# Patient Record
Sex: Male | Born: 1985 | Race: Black or African American | Hispanic: No | Marital: Married | Smoking: Never smoker
Health system: Southern US, Community
[De-identification: ages and names within clinical notes are randomized; demographics above are authoritative.]

## PROBLEM LIST (undated history)

## (undated) ENCOUNTER — Ambulatory Visit: Admission: EM | Source: Home / Self Care

## (undated) DIAGNOSIS — G510 Bell's palsy: Secondary | ICD-10-CM

## (undated) HISTORY — PX: ACHILLES TENDON REPAIR: SUR1153

## (undated) HISTORY — DX: Bell's palsy: G51.0

---

## 2011-11-17 ENCOUNTER — Emergency Department (HOSPITAL_COMMUNITY)
Admission: EM | Admit: 2011-11-17 | Discharge: 2011-11-17 | Disposition: A | Discharge status: Home / Self Care | Payer: BC Managed Care – PPO | Attending: Emergency Medicine | Admitting: Emergency Medicine

## 2011-11-17 DIAGNOSIS — R220 Localized swelling, mass and lump, head: Secondary | ICD-10-CM | POA: Insufficient documentation

## 2011-11-17 DIAGNOSIS — R142 Eructation: Secondary | ICD-10-CM | POA: Insufficient documentation

## 2011-11-17 DIAGNOSIS — R141 Gas pain: Secondary | ICD-10-CM | POA: Insufficient documentation

## 2011-11-17 DIAGNOSIS — K111 Hypertrophy of salivary gland: Secondary | ICD-10-CM | POA: Insufficient documentation

## 2011-11-17 DIAGNOSIS — R1310 Dysphagia, unspecified: Secondary | ICD-10-CM | POA: Insufficient documentation

## 2011-11-17 DIAGNOSIS — K047 Periapical abscess without sinus: Secondary | ICD-10-CM | POA: Insufficient documentation

## 2011-11-17 DIAGNOSIS — R070 Pain in throat: Secondary | ICD-10-CM | POA: Insufficient documentation

## 2011-11-17 DIAGNOSIS — R6884 Jaw pain: Secondary | ICD-10-CM | POA: Insufficient documentation

## 2011-11-17 MED ORDER — TRAMADOL-ACETAMINOPHEN 37.5-325 MG PO TABS: ORAL_TABLET | ORAL | Status: AC

## 2011-11-17 MED ORDER — PENICILLIN V POTASSIUM 500 MG PO TABS: 500.0000 mg | ORAL_TABLET | Freq: Four times a day (QID) | ORAL | Status: AC

## 2011-11-17 NOTE — ED Notes (Signed)
Patient presents with sore throat since yesterday with difficulty swallowing.

## 2011-11-17 NOTE — ED Provider Notes (Signed)
History     CSN: [id number] Arrival date & time: 11/17/2011 11:11 AM   First MD Initiated Contact with Patient 11/17/11 1146      Chief Complaint  Patient presents with  . Sore Throat    (Consider location/radiation/quality/duration/timing/severity/associated sxs/prior treatment) HPI  Patient relates yesterday started getting pain in his left lower jaw. He states it hurts when he swallows it also hurts when he chews food. He denies any fever or left ear pain. He states he's never had it before. Nothing makes it feel better. They also note that he has a swelling underneath his jaw that gets bigger and smaller.  No primary care physician or dentist  History reviewed. No pertinent past medical history.  History reviewed. No pertinent past surgical history.  History reviewed. No pertinent family history.  History  Substance Use Topics  . Smoking status: Current Some Day Smoker  . Smokeless tobacco: Not on file  . Alcohol Use: Yes   employed at [employer]    Review of Systems  All other systems reviewed and are negative.    Allergies  Review of patient's allergies indicates no known allergies.  Home Medications  none  BP 134/73  Pulse 57  Temp(Src) 98.2 F (36.8 C) (Oral)  Resp 16  SpO2 98% Vital signs bradycardia otherwise normal  Physical Exam  Nursing note and vitals reviewed. Constitutional: He is oriented to person, place, and time. He appears well-developed and well-nourished.  Non-toxic appearance. He does not appear ill. No distress.  HENT:  Head: Normocephalic and atraumatic.  Right Ear: External ear normal.  Left Ear: External ear normal.  Nose: Nose normal. No mucosal edema or rhinorrhea.  Mouth/Throat: Oropharynx is clear and moist and mucous membranes are normal. No dental abscesses or uvula swelling.       Patient's noted to have mild erythema of these left ear however still translucent and no free fluid seen behind it. On exam of his mouth his teeth  are in very good shape there is no dental caries noted. His posterior pharynx is normal without redness swelling or tonsillar enlargement. There issoft tissue swelling seen except around the base of his left molar where he has swelling and redness on the inner aspect and he appears to be having any impacted was sent to trying to come out of the gumline in the same area. He's also noted to have a swelling in the submental region that is approximately 2 cm in size and soft but slightly firm. They state that this gets bigger and larger.  Eyes: Conjunctivae and EOM are normal. Pupils are equal, round, and reactive to light.  Neck: Normal range of motion and full passive range of motion without pain. Neck supple.  Pulmonary/Chest: Effort normal. No respiratory distress. He has no rhonchi. He exhibits no crepitus.  Abdominal: Normal appearance. He exhibits distension.  Musculoskeletal: Normal range of motion. He exhibits no edema and no tenderness.       Moves all extremities well.   Lymphadenopathy:    He has no cervical adenopathy.  Neurological: He is alert and oriented to person, place, and time. He has normal strength. No cranial nerve deficit.  Skin: Skin is warm, dry and intact. No rash noted. No erythema. No pallor.  Psychiatric: He has a normal mood and affect. His speech is normal and behavior is normal. His mood appears not anxious.    ED Course  Procedures (including critical care time)  Results for orders placed during the hospital  encounter of 11/17/11  RAPID STREP SCREEN      Component Value Range   Streptococcus, Group A Screen (Direct) NEGATIVE  NEGATIVE     Discharge diagnosis 1. Abscess, dental   2. Salivary gland swelling    New Prescriptions   PENICILLIN V POTASSIUM (VEETID) 500 MG TABLET    Take 1 tablet (500 mg total) by mouth 4 (four) times daily.   TRAMADOL-ACETAMINOPHEN (ULTRACET) 37.5-325 MG PER TABLET    2 tabs po QID prn pain   Plan refer to oral surgery  MDM            Ward Givens, MD 11/17/11 2151

## 2012-01-14 ENCOUNTER — Encounter (HOSPITAL_COMMUNITY): Payer: Self-pay | Admitting: *Deleted

## 2012-01-14 ENCOUNTER — Emergency Department (HOSPITAL_COMMUNITY)
Admission: EM | Admit: 2012-01-14 | Discharge: 2012-01-14 | Disposition: A | Discharge status: Home / Self Care | Payer: BC Managed Care – PPO | Attending: Emergency Medicine | Admitting: Emergency Medicine

## 2012-01-14 DIAGNOSIS — R599 Enlarged lymph nodes, unspecified: Secondary | ICD-10-CM | POA: Insufficient documentation

## 2012-01-14 DIAGNOSIS — B9789 Other viral agents as the cause of diseases classified elsewhere: Secondary | ICD-10-CM | POA: Insufficient documentation

## 2012-01-14 DIAGNOSIS — R070 Pain in throat: Secondary | ICD-10-CM | POA: Insufficient documentation

## 2012-01-14 DIAGNOSIS — R05 Cough: Secondary | ICD-10-CM | POA: Insufficient documentation

## 2012-01-14 DIAGNOSIS — B349 Viral infection, unspecified: Secondary | ICD-10-CM

## 2012-01-14 DIAGNOSIS — R059 Cough, unspecified: Secondary | ICD-10-CM | POA: Insufficient documentation

## 2012-01-14 DIAGNOSIS — J3489 Other specified disorders of nose and nasal sinuses: Secondary | ICD-10-CM | POA: Insufficient documentation

## 2012-01-14 NOTE — ED Notes (Signed)
Sore throat and runny nose for one week.  No fever chills or sob

## 2012-01-14 NOTE — ED Provider Notes (Signed)
History     CSN: [id number]  Arrival date & time 01/14/12  1336   First MD Initiated Contact with Patient 01/14/12 1419      Chief Complaint  Patient presents with  . Sore Throat    (Consider location/radiation/quality/duration/timing/severity/associated sxs/prior treatment) Patient is a 26 y.o. male presenting with pharyngitis. The history is provided by the patient.  Sore Throat This is a new problem. The current episode started in the past 7 days. The problem occurs constantly. The problem has been unchanged. Associated symptoms include congestion, coughing and a sore throat. Pertinent negatives include no abdominal pain, chest pain, chills, fever, headaches, myalgias, nausea, neck pain, rash, vomiting or weakness. Associated symptoms comments: Positive rhinorrhea. The symptoms are aggravated by swallowing. Treatments tried: cough medicine. The treatment provided no relief.    History reviewed. No pertinent past medical history.  History reviewed. No pertinent past surgical history.  No family history on file.  History  Substance Use Topics  . Smoking status: Current Some Day Smoker  . Smokeless tobacco: Not on file  . Alcohol Use: Yes      Review of Systems  Constitutional: Negative for fever and chills.  HENT: Positive for congestion and sore throat. Negative for ear pain, trouble swallowing, neck pain and voice change.   Eyes: Negative for pain and visual disturbance.  Respiratory: Positive for cough. Negative for chest tightness and shortness of breath.   Cardiovascular: Negative for chest pain.  Gastrointestinal: Negative for nausea, vomiting and abdominal pain.  Musculoskeletal: Negative for myalgias and back pain.  Skin: Negative for rash.  Neurological: Negative for dizziness, weakness and headaches.  Psychiatric/Behavioral: Negative for confusion.    Allergies  Review of patient's allergies indicates no known allergies.  Home Medications   Current  Outpatient Rx  Name Route Sig Dispense Refill  . OVER THE COUNTER MEDICATION Oral Take 30 mLs by mouth at bedtime as needed. For cough/sleep  Adult cough medicine      BP 138/69  Pulse 68  Temp(Src) 98.3 F (36.8 C) (Oral)  Resp 20  SpO2 97%  Physical Exam  Nursing note and vitals reviewed. Constitutional: He is oriented to person, place, and time. He appears well-developed and well-nourished. No distress.  HENT:  Head: Normocephalic and atraumatic.  Right Ear: Hearing, external ear and ear canal normal. Tympanic membrane is erythematous.  Left Ear: Hearing, tympanic membrane, external ear and ear canal normal.  Nose: Rhinorrhea present. Right sinus exhibits no maxillary sinus tenderness and no frontal sinus tenderness. Left sinus exhibits no maxillary sinus tenderness and no frontal sinus tenderness.  Mouth/Throat: Mucous membranes are normal. No uvula swelling. Posterior oropharyngeal erythema present. No oropharyngeal exudate, posterior oropharyngeal edema or tonsillar abscesses.  Eyes: Conjunctivae are normal. Pupils are equal, round, and reactive to light.  Neck: Normal range of motion. Neck supple. No tracheal deviation present.  Cardiovascular: Normal rate, regular rhythm and normal heart sounds.   No murmur heard. Pulmonary/Chest: Effort normal and breath sounds normal. No stridor. No respiratory distress. He has no wheezes. He exhibits no tenderness.  Abdominal: Soft. Bowel sounds are normal. He exhibits no distension. There is no tenderness.  Musculoskeletal: He exhibits no edema and no tenderness.  Lymphadenopathy:    He has cervical adenopathy.  Neurological: He is alert and oriented to person, place, and time. No cranial nerve deficit.  Skin: Skin is warm and dry. No rash noted.    ED Course  Procedures (including critical care time)   Labs Reviewed  RAPID STREP SCREEN   No results found.   1. Viral illness       MDM  Viral pharyngitis s/s. Negative  strep screen. Discussed use of OTC medications with pt, who voices understanding.        Shaaron Adler, [place] 01/14/12 [phone]

## 2012-01-15 NOTE — ED Provider Notes (Signed)
Medical screening examination/treatment/procedure(s) were performed by non-physician practitioner and as supervising physician I was immediately available for consultation/collaboration.  Toy Baker, MD 01/15/12 [phone]

## 2012-04-07 ENCOUNTER — Encounter (HOSPITAL_COMMUNITY): Payer: Self-pay

## 2012-04-07 ENCOUNTER — Emergency Department (INDEPENDENT_AMBULATORY_CARE_PROVIDER_SITE_OTHER)
Admission: EM | Admit: 2012-04-07 | Discharge: 2012-04-07 | Disposition: A | Discharge status: Home / Self Care | Payer: BC Managed Care – PPO | Source: Home / Self Care | Attending: Family Medicine | Admitting: Family Medicine

## 2012-04-07 DIAGNOSIS — S86011A Strain of right Achilles tendon, initial encounter: Secondary | ICD-10-CM

## 2012-04-07 DIAGNOSIS — S93499A Sprain of other ligament of unspecified ankle, initial encounter: Secondary | ICD-10-CM

## 2012-04-07 DIAGNOSIS — X500XXA Overexertion from strenuous movement or load, initial encounter: Secondary | ICD-10-CM

## 2012-04-07 DIAGNOSIS — S96819A Strain of other specified muscles and tendons at ankle and foot level, unspecified foot, initial encounter: Secondary | ICD-10-CM

## 2012-04-07 MED ORDER — HYDROCODONE-ACETAMINOPHEN 5-325 MG PO TABS: ORAL_TABLET | ORAL | Status: AC

## 2012-04-07 NOTE — Discharge Instructions (Signed)
Please remain non-weightbearing until follow-up with Orthopaedic surgeon. Please call the Orthopaedic practice listed on your discharge papers. Take the medication as directed and as needed. Do not work or drive while taking this medication.

## 2012-04-07 NOTE — ED Provider Notes (Signed)
History     CSN: [id number]  Arrival date & time 04/07/12  1549   First MD Initiated Contact with Patient 04/07/12 1618      Chief Complaint  Patient presents with  . Leg Pain    (Consider location/radiation/quality/duration/timing/severity/associated sxs/prior treatment) HPI Comments: Gary Gardner presents for evaluation of pain in his RIGHT ankle. He reports onset of symptoms yesterday evening as he was playing basketball. He reports sudden acceleration from a standing point, at which point, he felt a sharp and sudden pain, as if he had been struck with a basketball in his ankle.    Patient is a 26 y.o. male presenting with leg pain. The history is provided by the patient.  Leg Pain  The incident occurred yesterday. The incident occurred at the gym. Injury mechanism: sudden acceleration. The pain is present in the right ankle. The quality of the pain is described as aching and throbbing. The pain is moderate. The pain has been constant since onset. Associated symptoms include inability to bear weight and loss of motion. He reports no foreign bodies present. The symptoms are aggravated by palpation, bearing weight and activity. He has tried nothing for the symptoms.    History reviewed. No pertinent past medical history.  History reviewed. No pertinent past surgical history.  History reviewed. No pertinent family history.  History  Substance Use Topics  . Smoking status: Current Some Day Smoker  . Smokeless tobacco: Not on file  . Alcohol Use: Yes      Review of Systems  Constitutional: Negative.   HENT: Negative.   Eyes: Negative.   Respiratory: Negative.   Cardiovascular: Negative.   Gastrointestinal: Negative.   Genitourinary: Negative.   Musculoskeletal: Positive for gait problem.       RIGHT ankle pain  Skin: Negative.   Neurological: Negative.     Allergies  Review of patient's allergies indicates no known allergies.  Home Medications   Current Outpatient Rx    Name Route Sig Dispense Refill  . OVER THE COUNTER MEDICATION Oral Take 30 mLs by mouth at bedtime as needed. For cough/sleep  Adult cough medicine    . HYDROCODONE-ACETAMINOPHEN 5-325 MG PO TABS  Take one to two tablets every 4 to 6 hours as needed for pain 30 tablet 0    BP 148/83  Pulse 54  Temp(Src) 97.6 F (36.4 C) (Oral)  Resp 18  SpO2 98%  Physical Exam  Nursing note and vitals reviewed. Constitutional: He is oriented to person, place, and time. He appears well-developed and well-nourished.  HENT:  Head: Normocephalic and atraumatic.  Eyes: EOM are normal.  Neck: Normal range of motion.  Pulmonary/Chest: Effort normal.  Musculoskeletal:       Right ankle: He exhibits decreased range of motion. He exhibits no ecchymosis and normal pulse. Achilles tendon exhibits pain and abnormal Thompson's test results. Achilles tendon exhibits no defect.  Neurological: He is alert and oriented to person, place, and time.  Skin: Skin is warm and dry.  Psychiatric: His behavior is normal.    ED Course  Procedures (including critical care time)  Labs Reviewed - No data to display No results found.   1. Achilles rupture, right       MDM  Placed in leg splint with mild plantar flexion, will follow up with [facility]; given rx for hydrocodone PRN        Renaee Munda, MD 04/07/12 1721

## 2012-04-07 NOTE — ED Notes (Signed)
States sudden onset of pain right distal calf area just as was beginning to play basket ball last PM; right achilles area tight, tender, calf tight ; painful w attempted flexion/extension of foot; food w/d/color good

## 2013-03-22 ENCOUNTER — Emergency Department (HOSPITAL_COMMUNITY)
Admission: EM | Admit: 2013-03-22 | Discharge: 2013-03-22 | Disposition: A | Discharge status: Home / Self Care | Payer: BC Managed Care – PPO | Source: Home / Self Care | Attending: Family Medicine | Admitting: Family Medicine

## 2013-03-22 ENCOUNTER — Encounter (HOSPITAL_COMMUNITY): Payer: Self-pay | Admitting: Emergency Medicine

## 2013-03-22 DIAGNOSIS — A0811 Acute gastroenteropathy due to Norwalk agent: Secondary | ICD-10-CM

## 2013-03-22 MED ORDER — ONDANSETRON 4 MG PO TBDP
4.0000 mg | ORAL_TABLET | Freq: Once | ORAL | Status: AC
  Administered 2013-03-22: 4 mg via ORAL

## 2013-03-22 MED ORDER — ONDANSETRON HCL 4 MG PO TABS: 4.0000 mg | ORAL_TABLET | Freq: Four times a day (QID) | ORAL | Status: DC

## 2013-03-22 MED ORDER — ONDANSETRON 4 MG PO TBDP
ORAL_TABLET | ORAL | Status: AC
  Filled 2013-03-22: qty 1

## 2013-03-22 NOTE — ED Notes (Signed)
Nnausea/diarrhea, no vomiting, no fever.  Onset yesterday

## 2013-03-22 NOTE — ED Provider Notes (Signed)
History     CSN: [id number]  Arrival date & time 03/22/13  1349   First MD Initiated Contact with Patient 03/22/13 1351      Chief Complaint  Patient presents with  . Nausea    (Consider location/radiation/quality/duration/timing/severity/associated sxs/prior treatment) Patient is a 27 y.o. male presenting with diarrhea. The history is provided by the patient.  Diarrhea Quality:  Watery (worse after chinese food and peanut butter and jelly today.) Severity:  Mild Duration:  1 day Timing:  Intermittent Progression:  Unchanged Associated symptoms: no chills, no fever and no vomiting   Associated symptoms comment:  Nausea. Risk factors: no sick contacts, no suspicious food intake and no travel to endemic areas     History reviewed. No pertinent past medical history.  History reviewed. No pertinent past surgical history.  No family history on file.  History  Substance Use Topics  . Smoking status: Never Smoker   . Smokeless tobacco: Not on file  . Alcohol Use: No      Review of Systems  Constitutional: Negative.  Negative for fever and chills.  Gastrointestinal: Positive for nausea and diarrhea. Negative for vomiting, blood in stool and anal bleeding.  Skin: Negative.     Allergies  Review of patient's allergies indicates no known allergies.  Home Medications   Current Outpatient Rx  Name  Route  Sig  Dispense  Refill  . ondansetron (ZOFRAN) 4 MG tablet   Oral   Take 1 tablet (4 mg total) by mouth every 6 (six) hours.   8 tablet   0   . OVER THE COUNTER MEDICATION   Oral   Take 30 mLs by mouth at bedtime as needed. For cough/sleep  Adult cough medicine           BP 122/78  Pulse 75  Temp(Src) 99.3 F (37.4 C) (Oral)  Resp 16  SpO2 100%  Physical Exam  Nursing note and vitals reviewed. Constitutional: He is oriented to person, place, and time. He appears well-developed and well-nourished.  HENT:  Head: Normocephalic.  Mouth/Throat:  Oropharynx is clear and moist.  Abdominal: Soft. Bowel sounds are normal. He exhibits no distension and no mass. There is no tenderness. There is no rebound and no guarding.  Neurological: He is alert and oriented to person, place, and time.  Skin: Skin is warm and dry.  Psychiatric: He has a normal mood and affect.    ED Course  Procedures (including critical care time)  Labs Reviewed - No data to display No results found.   1. Gastroenteritis due to norovirus       MDM          Linna Hoff, MD 03/22/13 1430

## 2013-08-24 ENCOUNTER — Emergency Department (INDEPENDENT_AMBULATORY_CARE_PROVIDER_SITE_OTHER)
Admission: EM | Admit: 2013-08-24 | Discharge: 2013-08-24 | Disposition: A | Discharge status: Home / Self Care | Payer: BC Managed Care – PPO | Source: Home / Self Care | Attending: Family Medicine | Admitting: Family Medicine

## 2013-08-24 ENCOUNTER — Encounter (HOSPITAL_COMMUNITY): Payer: Self-pay | Admitting: Emergency Medicine

## 2013-08-24 DIAGNOSIS — B029 Zoster without complications: Secondary | ICD-10-CM

## 2013-08-24 MED ORDER — PREDNISONE 5 MG PO TABS: ORAL_TABLET | ORAL | Status: DC

## 2013-08-24 MED ORDER — MUPIROCIN CALCIUM 2 % EX CREA: TOPICAL_CREAM | Freq: Three times a day (TID) | CUTANEOUS | Status: DC

## 2013-08-24 NOTE — ED Provider Notes (Signed)
Romualdo Prosise is a 27 y.o. male who presents to Urgent Care today for rash right trunk. Patient notes a vesicular itchy rash on his right side back extending onto the lateral chest wall. Has been present for about 2 weeks. He's tried using rubbing alcohol which has not helped. He denies any fevers chills radiating pain weakness or numbness nausea vomiting or diarrhea. He denies any injury. It is not painful. He feels well otherwise.   History reviewed. No pertinent past medical history. History  Substance Use Topics  . Smoking status: Never Smoker   . Smokeless tobacco: Not on file  . Alcohol Use: No   ROS as above Medications reviewed. No current facility-administered medications for this encounter.   Current Outpatient Prescriptions  Medication Sig Dispense Refill  . mupirocin cream (BACTROBAN) 2 % Apply topically 3 (three) times daily.  15 g  0  . predniSONE (DELTASONE) 5 MG tablet 3 pills po day 1-3 2 pills po day 4-6 1 pill   po day 7-10  18 tablet  0    Exam:  BP 132/61  Pulse 55  Temp(Src) 98.6 F (37 C) (Oral)  Resp 16  SpO2 100% Gen: Well NAD HEENT: EOMI,  MMM Lungs: CTABL Nl WOB Heart: RRR no MRG Exts: Non edematous BL  LE, warm and well perfused.  Skin: Crusted vesicular rash approximately 5 cm x 3 cm on right posterior chest wall in a dermatomal pattern.   No results found for this or any previous visit (from the past 24 hour(s)). No results found.  Assessment and Plan: 28 y.o. male with shingles. Patient is outside of one of her treatment with antivirals. Will use low-dose prednisone as well as Bactroban. Prednisone will help decrease inflammation and itching. Bactroban to treat possible secondary infection with bacteria. Followup as needed Discussed warning signs or symptoms. Please see discharge instructions. Patient expresses understanding.      Rodolph Bong, MD 08/24/13 1128

## 2013-08-24 NOTE — ED Notes (Signed)
Rash to right mid back, reports area itches badly.  Open wound, but raised, blistery appearance to area.  Denies any other areas

## 2013-12-31 ENCOUNTER — Encounter (HOSPITAL_COMMUNITY): Payer: Self-pay | Admitting: Emergency Medicine

## 2013-12-31 ENCOUNTER — Emergency Department (INDEPENDENT_AMBULATORY_CARE_PROVIDER_SITE_OTHER)
Admission: EM | Admit: 2013-12-31 | Discharge: 2013-12-31 | Disposition: A | Discharge status: Home / Self Care | Payer: BC Managed Care – PPO | Source: Home / Self Care | Attending: Emergency Medicine | Admitting: Emergency Medicine

## 2013-12-31 DIAGNOSIS — L42 Pityriasis rosea: Secondary | ICD-10-CM

## 2013-12-31 MED ORDER — HYDROXYZINE HCL 25 MG PO TABS: 25.0000 mg | ORAL_TABLET | Freq: Four times a day (QID) | ORAL | Status: DC

## 2013-12-31 MED ORDER — TRIAMCINOLONE ACETONIDE 0.1 % EX CREA: 1.0000 "application " | TOPICAL_CREAM | Freq: Three times a day (TID) | CUTANEOUS | Status: DC

## 2013-12-31 NOTE — ED Notes (Signed)
Pt c/o rash onset 1 week Rash on abd, chest, arms Reports his girlfriend had it but it went away Denies: f/v/n/d Alert w/no signs of acute distress.

## 2013-12-31 NOTE — Discharge Instructions (Signed)

## 2013-12-31 NOTE — ED Provider Notes (Signed)
  Chief Complaint    Chief Complaint  Patient presents with  . Rash    History of Present Illness      Gary Gardner is a 28 year old male who has had a one-week history of a rash that first appeared on his arms, spread to his trunk, face, neck, and legs. It is mildly itchy. He has not been in contact with any allergens or antigens. No new foods or medications. No systemic symptoms such as fever, chills, headache, URI symptoms, sore throat, adenopathy, or cough. He has not been exposed to anyone with a similar rash.  Review of Systems   Other than as noted above, the patient denies any of the following symptoms: Systemic:  No fever, chills, or myalgias. ENT:  No nasal congestion, rhinorrhea, sore throat, swelling of lips, tongue or throat. Resp:  No cough, wheezing, or shortness of breath.  PMFSH    Past medical history, family history, social history, meds, and allergies were reviewed.   Physical Exam     Vital signs:  BP 118/69  Pulse 67  Temp(Src) 97.7 F (36.5 C) (Oral)  Resp 17  SpO2 95% Gen:  Alert, oriented, in no distress. ENT:  Pharynx clear, no intraoral lesions, moist mucous membranes. Lungs:  Clear to auscultation. Skin:  He has a patulous squamous rash with hyperpigmented, scaly, oval lesions and a "Christmas tree" distribution.  Assessment    The encounter diagnosis was Pityriasis rosea.  Plan     1.  Meds:  The following meds were prescribed:   Discharge Medication List as of 12/31/2013 12:43 PM    START taking these medications   Details  hydrOXYzine (ATARAX/VISTARIL) 25 MG tablet Take 1 tablet (25 mg total) by mouth every 6 (six) hours., Starting 12/31/2013, Until Discontinued, Normal    triamcinolone cream (KENALOG) 0.1 % Apply 1 application topically 3 (three) times daily., Starting 12/31/2013, Until Discontinued, Normal        2.  Patient Education/Counseling:  The patient was given appropriate handouts, self care instructions, and instructed in  symptomatic relief.   3.  Follow up:  The patient was told to follow up here if no better in 3 to 4 days, or sooner if becoming worse in any way, and given some red flag symptoms such as worsening rash, fever, or difficulty breathing which would prompt immediate return.  Follow up here if necessary.      Reuben Likesavid C Kimbly Eanes, MD 12/31/13 2133

## 2016-02-07 ENCOUNTER — Ambulatory Visit (INDEPENDENT_AMBULATORY_CARE_PROVIDER_SITE_OTHER): Payer: BLUE CROSS/BLUE SHIELD | Admitting: Physician Assistant

## 2016-02-07 VITALS — BP 132/80 | HR 59 | Temp 97.5°F | Resp 16 | Ht 75.0 in | Wt 249.0 lb

## 2016-02-07 DIAGNOSIS — J069 Acute upper respiratory infection, unspecified: Secondary | ICD-10-CM | POA: Diagnosis not present

## 2016-02-07 DIAGNOSIS — B9789 Other viral agents as the cause of diseases classified elsewhere: Principal | ICD-10-CM

## 2016-02-07 NOTE — Patient Instructions (Signed)
Mucinex for congestion  Motrin and tylenol will hep with the lymph node swelling and pain associated - please check with [place] in 2 weeks

## 2016-02-07 NOTE — Progress Notes (Signed)
   Gary Gardner  MRN: [id number] DOB: [birth year]  Subjective:  Pt presents to clinic with swollen lymph node on the left side of his neck that he noticed yesterday and he is worried.  He went to [facility] 2 days ago for a 2 days illness and was started on Zpack, albuterol (not helping and makes him feel strange) and cough medications (they are helping him sleep).  He wants to make sure nothing is going on to cause the swelling.  It is tender to the touch.  There are no active problems to display for this patient.   No current outpatient prescriptions on file prior to visit.   No current facility-administered medications on file prior to visit.    No Known Allergies  Review of Systems  Constitutional: Negative for fever and chills.  HENT: Positive for congestion and postnasal drip.   Respiratory: Positive for cough.    Objective:  BP 132/80 mmHg  Pulse 59  Temp(Src) 97.5 F (36.4 C)  Resp 16  Ht  (1.905 m)  Wt 249 lb (112.946 kg)  BMI 31.12 kg/m2  SpO2 98%  Physical Exam  Constitutional: He is oriented to person, place, and time and well-developed, well-nourished, and in no distress.  HENT:  Head: Normocephalic and atraumatic.  Right Ear: Hearing, tympanic membrane, external ear and ear canal normal.  Left Ear: Hearing, tympanic membrane, external ear and ear canal normal.  Nose: Mucosal edema (red) present.  Mouth/Throat: Uvula is midline, oropharynx is clear and moist and mucous membranes are normal.  Eyes: Conjunctivae are normal.  Neck: Normal range of motion.  Cardiovascular: Normal rate, regular rhythm and normal heart sounds.   Pulmonary/Chest: Effort normal and breath sounds normal. He has no wheezes.  Lymphadenopathy:       Head (right side): No submandibular and no tonsillar adenopathy present.       Head (left side): Submandibular (mild TTP) adenopathy present. No tonsillar adenopathy present.    He has cervical adenopathy.       Right cervical: No  superficial cervical adenopathy present.      Left cervical: Superficial cervical adenopathy present.       Right: No supraclavicular adenopathy present.       Left: No supraclavicular adenopathy present.  Neurological: He is alert and oriented to person, place, and time. Gait normal.  Skin: Skin is warm and dry.  Psychiatric: Mood, memory, affect and judgment normal.    Assessment and Plan :  Viral URI with cough - Plan: Care order/instruction   D/w pt that lymph node swelling and TTP are normal with URI as they are a filtration system. He will monitor and recheck with me in 2 weeks if it is not resolved.  He will add motrin and mucinex to his regimen to help with his symptoms.  Gary Lennert PA-C  [facility] 02/07/2016 10:35 AM

## 2017-03-25 ENCOUNTER — Encounter (HOSPITAL_COMMUNITY): Payer: Self-pay | Admitting: *Deleted

## 2017-03-25 ENCOUNTER — Ambulatory Visit (HOSPITAL_COMMUNITY)
Admission: EM | Admit: 2017-03-25 | Discharge: 2017-03-25 | Disposition: A | Discharge status: Home / Self Care | Payer: Self-pay

## 2017-03-25 ENCOUNTER — Emergency Department (HOSPITAL_COMMUNITY)
Admission: EM | Admit: 2017-03-25 | Discharge: 2017-03-25 | Disposition: A | Discharge status: Home / Self Care | Payer: BLUE CROSS/BLUE SHIELD | Attending: Emergency Medicine | Admitting: Emergency Medicine

## 2017-03-25 DIAGNOSIS — G510 Bell's palsy: Secondary | ICD-10-CM | POA: Diagnosis not present

## 2017-03-25 DIAGNOSIS — R200 Anesthesia of skin: Secondary | ICD-10-CM | POA: Diagnosis present

## 2017-03-25 MED ORDER — ARTIFICIAL TEARS OP OINT: TOPICAL_OINTMENT | OPHTHALMIC | 0 refills | Status: DC | PRN

## 2017-03-25 MED ORDER — PREDNISONE 10 MG PO TABS: 60.0000 mg | ORAL_TABLET | Freq: Every day | ORAL | 0 refills | Status: DC

## 2017-03-25 NOTE — ED Triage Notes (Signed)
Pt states loss of taste and L ear congestion x 2 weeks.  Then today at 2pm he began experiencing numbness to L cheek, L arm and R upper lip.

## 2017-03-25 NOTE — ED Provider Notes (Signed)
[facility]-EMERGENCY DEPT Provider Note   CSN: [id number] Arrival date & time: 03/25/17  1523     History   Chief Complaint Chief Complaint  Patient presents with  . Numbness    ear congestion    HPI Gary Gardner is a 31 y.o. male.  Patient presents with four-hour history of numbness left upper extremity, and numbness of right upper lip and left cheek. He has had URI symptoms for the past 1.5 weeks including sneezing, cough, runny nose, and ear pain. He states that he has never had any previous symptoms similar to this in regards to the numbness. Has tried over-the-counter medications for URI symptoms but unsure what the name was. He states he is able to walk. He states that the numbness in his left arm feels like "my arm is asleep and it is tingling." Also reports loss of taste.  No history of prior strokes. Patient denies history of high blood pressure or cholesterol. He states he has not seen a PCP in many years. Denies chest pain, trouble breathing, hemoptysis, fever, appetite changes, recent injury to head, use of blood thinners.      History reviewed. No pertinent past medical history.  There are no active problems to display for this patient.   History reviewed. No pertinent surgical history.     Home Medications    Prior to Admission medications   Medication Sig Start Date End Date Taking? Authorizing Provider  albuterol (PROVENTIL HFA;VENTOLIN HFA) 108 (90 Base) MCG/ACT inhaler Inhale into the lungs every 6 (six) hours as needed for wheezing or shortness of breath.    Historical Provider, MD  artificial tears (LACRILUBE) OINT ophthalmic ointment Place into both eyes every 4 (four) hours as needed for dry eyes. 03/25/17   Takari Lundahl, PA-C  azithromycin (ZITHROMAX) 250 MG tablet Take by mouth daily.    Historical Provider, MD  benzonatate (TESSALON) 100 MG capsule Take by mouth 3 (three) times daily as needed for cough.    Historical Provider, MD    HYDROcodone-homatropine (HYCODAN) 5-1.5 MG/5ML syrup Take 5 mLs by mouth every 6 (six) hours as needed for cough.    Historical Provider, MD  predniSONE (DELTASONE) 10 MG tablet Take 6 tablets (60 mg total) by mouth daily. 03/25/17   Hendrix Console, PA-C    Family History No family history on file.  Social History Social History  Substance Use Topics  . Smoking status: Never Smoker  . Smokeless tobacco: Never Used  . Alcohol use Yes     Comment: occasionally     Allergies   Patient has no known allergies.   Review of Systems Review of Systems  Constitutional: Negative for appetite change, chills and fever.  HENT: Positive for ear pain, rhinorrhea and sneezing. Negative for sore throat.   Eyes: Negative for photophobia and visual disturbance.  Respiratory: Positive for cough. Negative for chest tightness, shortness of breath and wheezing.   Cardiovascular: Negative for chest pain and palpitations.  Gastrointestinal: Negative for abdominal pain, blood in stool, constipation, diarrhea, nausea and vomiting.  Endocrine: Negative for polyuria.  Genitourinary: Negative for dysuria, hematuria and urgency.  Musculoskeletal: Negative for myalgias.  Skin: Negative for rash.  Neurological: Positive for numbness. Negative for dizziness, weakness and light-headedness.     Physical Exam Updated Vital Signs BP 131/89   Pulse 68   Temp 98.5 F (36.9 C) (Oral)   Resp 16   Ht  (1.88 m)   Wt 108.9 kg   SpO2 97%  BMI 30.81 kg/m   Physical Exam  Constitutional: He is oriented to person, place, and time. He appears well-developed and well-nourished. No distress.  HENT:  Head: Normocephalic and atraumatic.  Right Ear: Tympanic membrane is not erythematous and not bulging.  Left Ear: Tympanic membrane is erythematous. Tympanic membrane is not bulging.  Nose: Nose normal.  Mouth/Throat: Uvula is midline and oropharynx is clear and moist.  Eyes: Conjunctivae and EOM are normal.  Pupils are equal, round, and reactive to light. Right eye exhibits no discharge. Left eye exhibits no discharge. No scleral icterus.  Neck: Normal range of motion. Neck supple.  Cardiovascular: Normal rate, regular rhythm, normal heart sounds and intact distal pulses.  Exam reveals no gallop and no friction rub.   No murmur heard. Pulmonary/Chest: Effort normal and breath sounds normal. No respiratory distress.  Abdominal: Soft. Bowel sounds are normal. He exhibits no distension. There is no tenderness. There is no guarding.  Musculoskeletal: Normal range of motion. He exhibits no edema or tenderness.  Neurological: He is alert and oriented to person, place, and time. No sensory deficit. He exhibits normal muscle tone. Coordination normal.  No facial droop present. Asymmetry when patient raises eyebrows, smiles, blinks and puffs out cheeks. Has normal sensation bilaterally. Normal strength bilaterally in upper extremities. Uvula is midline. Tongue is midline and extended.  Skin: Skin is warm and dry. No rash noted. He is not diaphoretic.  Psychiatric: He has a normal mood and affect.  Nursing note and vitals reviewed.    ED Treatments / Results  Labs (all labs ordered are listed, but only abnormal results are displayed) Labs Reviewed - No data to display  EKG  EKG Interpretation None       Radiology No results found.  Procedures Procedures (including critical care time)  Medications Ordered in ED Medications - No data to display   Initial Impression / Assessment and Plan / ED Course  I have reviewed the triage vital signs and the nursing notes.  Pertinent labs & imaging results that were available during my care of the patient were reviewed by me and considered in my medical decision making (see chart for details).     Patient's history and symptoms concerning for Bell's palsy versus viral URI versus stroke. Patient does not have any neurological deficits besides from  facial asymmetry. No need for imaging at this time. Symptoms are consistent with Bell's palsy due to taste alteration, URI symptoms prior to facial symptoms, no prior history or past medical history suggesting stroke, normal gait and normal strength bilaterally. Because he is out of the window for antivirals (symptoms have been going on for over one week) we will treat with prednisone for 5 days. Also advised him to use eye ointment provided to keep eye hydrated and not irritated. Patient was educated on the possible outcomes of Bell's palsy including possibility of improving or worsening or no changes. Return precautions given.  Final Clinical Impressions(s) / ED Diagnoses   Final diagnoses:  Bell's palsy    New Prescriptions New Prescriptions   ARTIFICIAL TEARS (LACRILUBE) OINT OPHTHALMIC OINTMENT    Place into both eyes every 4 (four) hours as needed for dry eyes.   PREDNISONE (DELTASONE) 10 MG TABLET    Take 6 tablets (60 mg total) by mouth daily.     Dietrich Pates, PA-C 03/25/17 1191

## 2017-03-25 NOTE — ED Notes (Signed)
No labs or stroke orders needed, per Dr Patria Mane.

## 2017-03-25 NOTE — ED Provider Notes (Signed)
Medical screening examination/treatment/procedure(s) were conducted as a shared visit with non-physician practitioner(s) and myself.  I personally evaluated the patient during the encounter.   EKG Interpretation None       31 year old male presents with one week of left-sided facial paresthesias as well as trouble closing his eye. Exam patient has symptoms consistent with Bell's palsy. Will prescribe prednisone and also eyedrops.   Lorre Nick, MD 03/25/17 [id number]

## 2017-03-25 NOTE — Discharge Instructions (Signed)
Begin taking prednisone 60 mg for 5 days. Apply ointment to eyes as needed for irritation and to keep hydrated. Continue symptomatic treatment for upper respiratory symptoms. Return to ED for worsening symptoms, weakness, trouble walking, headache, chest pain, trouble breathing, additional injury.

## 2017-04-01 ENCOUNTER — Ambulatory Visit (INDEPENDENT_AMBULATORY_CARE_PROVIDER_SITE_OTHER): Payer: BLUE CROSS/BLUE SHIELD | Admitting: Family Medicine

## 2017-04-01 ENCOUNTER — Encounter: Payer: Self-pay | Admitting: Family Medicine

## 2017-04-01 VITALS — BP 110/70 | HR 68 | Ht 75.0 in | Wt 255.0 lb

## 2017-04-01 DIAGNOSIS — Z7689 Persons encountering health services in other specified circumstances: Secondary | ICD-10-CM | POA: Diagnosis not present

## 2017-04-01 DIAGNOSIS — G510 Bell's palsy: Secondary | ICD-10-CM | POA: Diagnosis not present

## 2017-04-01 DIAGNOSIS — Z833 Family history of diabetes mellitus: Secondary | ICD-10-CM | POA: Insufficient documentation

## 2017-04-01 LAB — COMPREHENSIVE METABOLIC PANEL
ALBUMIN: 4.4 g/dL (ref 3.6–5.1)
ALT: 11 U/L (ref 9–46)
AST: 13 U/L (ref 10–40)
Alkaline Phosphatase: 70 U/L (ref 40–115)
BILIRUBIN TOTAL: 0.4 mg/dL (ref 0.2–1.2)
BUN: 14 mg/dL (ref 7–25)
CALCIUM: 9.5 mg/dL (ref 8.6–10.3)
CO2: 27 mmol/L (ref 20–31)
CREATININE: 1.05 mg/dL (ref 0.60–1.35)
Chloride: 100 mmol/L (ref 98–110)
Glucose, Bld: 113 mg/dL — ABNORMAL HIGH (ref 65–99)
Potassium: 4.5 mmol/L (ref 3.5–5.3)
SODIUM: 138 mmol/L (ref 135–146)
TOTAL PROTEIN: 7.3 g/dL (ref 6.1–8.1)

## 2017-04-01 LAB — CBC WITH DIFFERENTIAL/PLATELET
Basophils Absolute: 0 cells/uL (ref 0–200)
Basophils Relative: 0 %
EOS PCT: 1 %
Eosinophils Absolute: 59 cells/uL (ref 15–500)
HEMATOCRIT: 47.5 % (ref 38.5–50.0)
HEMOGLOBIN: 15.9 g/dL (ref 13.2–17.1)
LYMPHS ABS: 1475 {cells}/uL (ref 850–3900)
Lymphocytes Relative: 25 %
MCH: 29 pg (ref 27.0–33.0)
MCHC: 33.5 g/dL (ref 32.0–36.0)
MCV: 86.7 fL (ref 80.0–100.0)
MONO ABS: 295 {cells}/uL (ref 200–950)
MPV: 11.9 fL (ref 7.5–12.5)
Monocytes Relative: 5 %
NEUTROS ABS: 4071 {cells}/uL (ref 1500–7800)
NEUTROS PCT: 69 %
Platelets: 186 10*3/uL (ref 140–400)
RBC: 5.48 MIL/uL (ref 4.20–5.80)
RDW: 13.7 % (ref 11.0–15.0)
WBC: 5.9 10*3/uL (ref 4.0–10.5)

## 2017-04-01 NOTE — Patient Instructions (Signed)
Bell Palsy Bell palsy is a condition in which the muscles on one side of the face become paralyzed. This often causes one side of the face to droop. It is a common condition and most people recover completely. RISK FACTORS Risk factors for Bell palsy include:  Pregnancy.  Diabetes.  An infection by a virus, such as infections that cause cold sores. CAUSES  Bell palsy is caused by damage to or inflammation of a nerve in your face. It is unclear why this happens, but an infection by a virus may lead to it. Most of the time the reason it happens is unknown. SIGNS AND SYMPTOMS  Symptoms can range from mild to severe and can take place over a number of hours. Symptoms may include:  Being unable to:  Raise one or both eyebrows.  Close one or both eyes.  Feel parts of your face (facial numbness).  Drooping of the eyelid and corner of the mouth.  Weakness in the face.  Paralysis of half your face.  Loss of taste.  Sensitivity to loud noises.  Difficulty chewing.  Tearing up of the affected eye.  Dryness in the affected eye.  Drooling.  Pain behind one ear. DIAGNOSIS  Diagnosis of Bell palsy may include:  A medical history and physical exam.  An MRI.  A CT scan.  Electromyography (EMG). This is a test that checks how your nerves are working. TREATMENT  Treatment may include antiviral medicine to help shorten the length of the condition. Sometimes treatment is not needed and the symptoms go away on their own. HOME CARE INSTRUCTIONS   Take medicines only as directed by your health care provider.  Do facial massages and exercises as directed by your health care provider.  If your eye is affected:  Use moisturizing eye drops to prevent drying of your eye as directed by your health care provider.  Protect your eye as directed by your health care provider. SEEK MEDICAL CARE IF:  Your symptoms do not get better or get worse.  You are drooling.  Your eye is red,  irritated, or hurts. SEEK IMMEDIATE MEDICAL CARE IF:   Another part of your body feels weak or numb.  You have difficulty swallowing.  You have a fever along with symptoms of Bell palsy.  You develop neck pain. MAKE SURE YOU:   Understand these instructions.  Will watch your condition.  Will get help right away if you are not doing well or get worse. This information is not intended to replace advice given to you by your health care provider. Make sure you discuss any questions you have with your health care provider. Document Released: 11/24/2005 Document Revised: 08/15/2015 Document Reviewed: 03/03/2014 Elsevier Interactive Patient Education  2017 Elsevier Inc.  

## 2017-04-01 NOTE — Progress Notes (Signed)
   Subjective:    Patient ID: Gary Gardner, male    DOB: November 22, 1986, 31 y.o.   MRN: [id number]  HPI Chief Complaint  Patient presents with  . new pt   He is new to the practice and here with complaints of recent diagnosis of Bell's Palsy in the ED on 03/25/2017. He has been taking steroids and using eye drops as recommended. He completed the steroids. States he is feeling much better but continues to have decreased taste. No longer having issues keeping his left eye closed. States he no longer has a facial droop.   Denies fever, chills, headache, dizziness, vision changes, tinnitus, neck pain, URI symptoms, chest pain, palpitations, shortness of breath, abdominal pain, N/V/D.  No numbness, tingling or weakness.   No other concerns or complaints today. Denies any significant past medical history.  Family history significant for diabetes and HTN.   States he is married and has 2 daughters. Works at [employer]  Denies smoking, drug use. Drinks alcohol occasionally.   Reviewed allergies, medications, past medical, surgical, family, and social history.    Review of Systems Pertinent positives and negatives in the history of present illness.     Objective:   Physical Exam  Constitutional: He is oriented to person, place, and time. He appears well-developed and well-nourished. He does not have a sickly appearance. No distress.  HENT:  Right Ear: Tympanic membrane and ear canal normal.  Left Ear: Tympanic membrane and ear canal normal.  Nose: Nose normal. Right sinus exhibits no maxillary sinus tenderness and no frontal sinus tenderness. Left sinus exhibits no maxillary sinus tenderness and no frontal sinus tenderness.  Mouth/Throat: Uvula is midline, oropharynx is clear and moist and mucous membranes are normal.  Eyes: Conjunctivae, EOM and lids are normal. Pupils are equal, round, and reactive to light.  Neck: Normal range of motion. Neck supple.  Cardiovascular: Normal rate, regular rhythm  and normal heart sounds.   Pulmonary/Chest: Effort normal and breath sounds normal.  Lymphadenopathy:    He has no cervical adenopathy.       Right: No supraclavicular adenopathy present.       Left: No supraclavicular adenopathy present.  Neurological: He is alert and oriented to person, place, and time. He has normal strength. No cranial nerve deficit or sensory deficit. He displays a negative Romberg sign. Coordination and gait normal.  No facial asymmetry.   Skin: Skin is warm and dry. No rash noted. He is not diaphoretic. No pallor.  Psychiatric: He has a normal mood and affect. His speech is normal and behavior is normal. Thought content normal. Cognition and memory are normal.   BP 110/70   Pulse 68   Ht  (1.905 m)   Wt 255 lb (115.7 kg)   SpO2 96%   BMI 31.87 kg/m       Assessment & Plan:  Bell's palsy - Plan: CBC with Differential/Platelet, Comprehensive metabolic panel  Encounter to establish care  Family history of diabetes mellitus  He appears to be significantly improved but not quite yet back to baseline. Normal neuro exam.  He will let me know if he notices any new or worsening symptoms.  Plan to check labs.  Will have him follow up as needed or for a fasting CPE.

## 2017-04-03 ENCOUNTER — Institutional Professional Consult (permissible substitution): Payer: BLUE CROSS/BLUE SHIELD | Admitting: Medical

## 2018-02-16 ENCOUNTER — Ambulatory Visit: Payer: BLUE CROSS/BLUE SHIELD | Admitting: Emergency Medicine

## 2018-02-16 ENCOUNTER — Encounter: Payer: Self-pay | Admitting: Emergency Medicine

## 2018-02-16 VITALS — BP 130/80 | HR 67 | Temp 98.2°F | Resp 17 | Ht 75.0 in | Wt 270.0 lb

## 2018-02-16 DIAGNOSIS — J029 Acute pharyngitis, unspecified: Secondary | ICD-10-CM | POA: Insufficient documentation

## 2018-02-16 LAB — POCT RAPID STREP A (OFFICE): Rapid Strep A Screen: NEGATIVE

## 2018-02-16 MED ORDER — AZITHROMYCIN 250 MG PO TABS: ORAL_TABLET | ORAL | 0 refills | Status: DC

## 2018-02-16 NOTE — Progress Notes (Signed)
Gary Gardner 32 y.o.   Chief Complaint  Patient presents with  . Sore Throat    HISTORY OF PRESENT ILLNESS: This is a 32 y.o. male complaining of sore throat since yesterday with no other significant symptoms.  Sore Throat   This is a new problem. The current episode started yesterday. The problem has been gradually worsening. There has been no fever. The pain is at a severity of 5/10. The pain is moderate. Associated symptoms include ear pain (Right ear). Pertinent negatives include no abdominal pain, congestion, coughing, diarrhea, headaches, neck pain, shortness of breath, stridor, swollen glands, trouble swallowing or vomiting. He has had no exposure to strep or mono. He has tried nothing for the symptoms.     Prior to Admission medications   Not on File    No Known Allergies  Patient Active Problem List   Diagnosis Date Noted  . Bell's palsy 04/01/2017  . Family history of diabetes mellitus 04/01/2017    Past Medical History:  Diagnosis Date  . Bell's palsy     Past Surgical History:  Procedure Laterality Date  . ACHILLES TENDON REPAIR      Social History   Socioeconomic History  . Marital status: Single    Spouse name: Not on file  . Number of children: Not on file  . Years of education: Not on file  . Highest education level: Not on file  Social Needs  . Financial resource strain: Not on file  . Food insecurity - worry: Not on file  . Food insecurity - inability: Not on file  . Transportation needs - medical: Not on file  . Transportation needs - non-medical: Not on file  Occupational History  . Not on file  Tobacco Use  . Smoking status: Never Smoker  . Smokeless tobacco: Never Used  Substance and Sexual Activity  . Alcohol use: Yes    Comment: occasionally  . Drug use: No  . Sexual activity: No  Other Topics Concern  . Not on file  Social History Narrative  . Not on file    Family History  Problem Relation Age of Onset  . Diabetes  Mother   . Hypertension Mother   . Hypertension Father      Review of Systems  Constitutional: Negative.  Negative for chills and fever.  HENT: Positive for ear pain (Right ear). Negative for congestion and trouble swallowing.   Eyes: Negative.  Negative for discharge and redness.  Respiratory: Negative.  Negative for cough, shortness of breath and stridor.   Cardiovascular: Negative.  Negative for chest pain, palpitations and leg swelling.  Gastrointestinal: Negative.  Negative for abdominal pain, blood in stool, diarrhea and vomiting.  Genitourinary: Negative.  Negative for dysuria and hematuria.  Musculoskeletal: Negative.  Negative for neck pain.  Skin: Negative.  Negative for rash.  Neurological: Negative for dizziness and headaches.  Endo/Heme/Allergies: Negative.   All other systems reviewed and are negative.   Vitals:   02/16/18 1115  BP: 130/80  Pulse: 67  Resp: 17  Temp: 98.2 F (36.8 C)  SpO2: 98%    Physical Exam  Constitutional: He is oriented to person, place, and time. He appears well-developed and well-nourished.  HENT:  Head: Normocephalic and atraumatic.  Right Ear: Hearing, tympanic membrane and ear canal normal.  Left Ear: Hearing, tympanic membrane and ear canal normal.  Mouth/Throat: No oral lesions. No uvula swelling. Posterior oropharyngeal erythema present. No oropharyngeal exudate, posterior oropharyngeal edema or tonsillar abscesses.  Eyes: Conjunctivae  and EOM are normal. Pupils are equal, round, and reactive to light.  Neck: Normal range of motion. Neck supple. No JVD present.  Cardiovascular: Normal rate, regular rhythm and normal heart sounds.  Pulmonary/Chest: Effort normal and breath sounds normal.  Abdominal: Soft. Bowel sounds are normal. He exhibits no distension. There is no tenderness.  Musculoskeletal: Normal range of motion.  Lymphadenopathy:    He has no cervical adenopathy.  Neurological: He is alert and oriented to person,  place, and time. No sensory deficit. He exhibits normal muscle tone.  Skin: Skin is warm and dry. Capillary refill takes less than 2 seconds.  Psychiatric: He has a normal mood and affect. His behavior is normal.  Vitals reviewed.    ASSESSMENT & PLAN: Gary Gardner was seen today for sore throat.  Diagnoses and all orders for this visit:  Sore throat -     Culture, Group A Strep -     POCT rapid strep A  Acute pharyngitis, unspecified etiology -     Culture, Group A Strep -     POCT rapid strep A -     azithromycin (ZITHROMAX) 250 MG tablet; Sig as indicated    Patient Instructions       IF you received an x-ray today, you will receive an invoice from [facility]. Please contact [facility] at [phone] with questions or concerns regarding your invoice.   IF you received labwork today, you will receive an invoice from [facility]. Please contact [facility] at [phone] with questions or concerns regarding your invoice.   Our billing staff will not be able to assist you with questions regarding bills from these companies.  You will be contacted with the lab results as soon as they are available. The fastest way to get your results is to activate your My Chart account. Instructions are located on the last page of this paperwork. If you have not heard from [place] regarding the results in 2 weeks, please contact this office.      Pharyngitis Pharyngitis is a sore throat (pharynx). There is redness, pain, and swelling of your throat. Follow these instructions at home:  Drink enough fluids to keep your pee (urine) clear or pale yellow.  Only take medicine as told by your doctor. ? You may get sick again if you do not take medicine as told. Finish your medicines, even if you start to feel better. ? Do not take aspirin.  Rest.  Rinse your mouth (gargle) with salt water ( tsp of salt per 1 qt of water) every 1-2 hours. This will help the pain.  If you are not at  risk for choking, you can suck on hard candy or sore throat lozenges. Contact a doctor if:  You have large, tender lumps on your neck.  You have a rash.  You cough up green, yellow-brown, or bloody spit. Get help right away if:  You have a stiff neck.  You drool or cannot swallow liquids.  You throw up (vomit) or are not able to keep medicine or liquids down.  You have very bad pain that does not go away with medicine.  You have problems breathing (not from a stuffy nose). This information is not intended to replace advice given to you by your health care provider. Make sure you discuss any questions you have with your health care provider. Document Released: 05/12/2008 Document Revised: 05/01/2016 Document Reviewed: 08/01/2013 Elsevier Interactive Patient Education  2017 [employer].      [place],  MD [facility]

## 2018-02-16 NOTE — Patient Instructions (Addendum)
     IF you received an x-ray today, you will receive an invoice from [facility]. Please contact [facility] at [phone] with questions or concerns regarding your invoice.   IF you received labwork today, you will receive an invoice from [facility]. Please contact [facility] at [phone] with questions or concerns regarding your invoice.   Our billing staff will not be able to assist you with questions regarding bills from these companies.  You will be contacted with the lab results as soon as they are available. The fastest way to get your results is to activate your My Chart account. Instructions are located on the last page of this paperwork. If you have not heard from us regarding the results in 2 weeks, please contact this office.     Pharyngitis Pharyngitis is a sore throat (pharynx). There is redness, pain, and swelling of your throat. Follow these instructions at home:  Drink enough fluids to keep your pee (urine) clear or pale yellow.  Only take medicine as told by your doctor.  You may get sick again if you do not take medicine as told. Finish your medicines, even if you start to feel better.  Do not take aspirin.  Rest.  Rinse your mouth (gargle) with salt water ( tsp of salt per 1 qt of water) every 1-2 hours. This will help the pain.  If you are not at risk for choking, you can suck on hard candy or sore throat lozenges. Contact a doctor if:  You have large, tender lumps on your neck.  You have a rash.  You cough up green, yellow-brown, or bloody spit. Get help right away if:  You have a stiff neck.  You drool or cannot swallow liquids.  You throw up (vomit) or are not able to keep medicine or liquids down.  You have very bad pain that does not go away with medicine.  You have problems breathing (not from a stuffy nose). This information is not intended to replace advice given to you by your health care provider. Make sure you  discuss any questions you have with your health care provider. Document Released: 05/12/2008 Document Revised: 05/01/2016 Document Reviewed: 08/01/2013 Elsevier Interactive Patient Education  2017 Elsevier Inc.  

## 2018-02-18 LAB — CULTURE, GROUP A STREP

## 2018-02-23 ENCOUNTER — Encounter: Payer: Self-pay | Admitting: *Deleted

## 2018-07-22 ENCOUNTER — Ambulatory Visit (INDEPENDENT_AMBULATORY_CARE_PROVIDER_SITE_OTHER): Payer: BLUE CROSS/BLUE SHIELD | Admitting: Family Medicine

## 2018-07-22 ENCOUNTER — Encounter: Payer: Self-pay | Admitting: Family Medicine

## 2018-07-22 VITALS — BP 120/80 | HR 64 | Temp 97.6°F | Wt 266.2 lb

## 2018-07-22 DIAGNOSIS — B353 Tinea pedis: Secondary | ICD-10-CM

## 2018-07-22 MED ORDER — TERBINAFINE HCL 1 % EX CREA: 1.0000 "application " | TOPICAL_CREAM | Freq: Two times a day (BID) | CUTANEOUS | 0 refills | Status: DC

## 2018-07-22 NOTE — Patient Instructions (Addendum)
Use the cream as prescribed for the next 1-2 weeks.    Athlete's Foot Athlete's foot (tinea pedis) is a fungal infection of the skin on the feet. It often occurs on the skin that is between or underneath the toes. It can also occur on the soles of the feet. The infection can spread from person to person (is contagious). What are the causes? Athlete's foot is caused by a fungus. This fungus grows in warm, moist places. Most people get athlete's foot by sharing shower stalls, towels, and wet floors with someone who is infected. Not washing your feet or changing your socks often enough can contribute to athlete's foot. What increases the risk? This condition is more likely to develop in:  Men.  People who have a weak body defense system (immune system).  People who have diabetes.  People who use public showers, such as at a gym.  People who wear heavy-duty shoes, such as Youth workerindustrial or military shoes.  Seasons with warm, humid weather.  What are the signs or symptoms? Symptoms of this condition include:  Itchy areas between the toes or on the soles of the feet.  White, flaky, or scaly areas between the toes or on the soles of the feet.  Very itchy small blisters between the toes or on the soles of the feet.  Small cuts on the skin. These cuts can become infected.  Thick or discolored toenails.  How is this diagnosed? This condition is diagnosed with a medical history and physical exam. Your health care provider may also take a skin or toenail sample to be examined. How is this treated? Treatment for this condition includes antifungal medicines. These may be applied as powders, ointments, or creams. In severe cases, an oral antifungal medicine may be given. Follow these instructions at home:  Apply or take over-the-counter and prescription medicines only as told by your health care provider.  Keep all follow-up visits as told by your health care provider. This is  important.  Do not scratch your feet.  Keep your feet dry: ? Wear cotton or wool socks. Change your socks every day or if they become wet. ? Wear shoes that allow air to circulate, such as sandals or canvas tennis shoes.  Wash and dry your feet: ? Every day or as told by your health care provider. ? After exercising. ? Including the area between your toes.  Do not share towels, nail clippers, or other personal items that touch your feet with others.  If you have diabetes, keep your blood sugar under control. How is this prevented?  Do not share towels.  Wear sandals in wet areas, such as locker rooms and shared showers.  Keep your feet dry: ? Wear cotton or wool socks. Change your socks every day or if they become wet. ? Wear shoes that allow air to circulate, such as sandals or canvas tennis shoes.  Wash and dry your feet after exercising. Pay attention to the area between your toes. Contact a health care provider if:  You have a fever.  You have swelling, soreness, warmth, or redness in your foot.  You are not getting better with treatment.  Your symptoms get worse.  You have new symptoms. This information is not intended to replace advice given to you by your health care provider. Make sure you discuss any questions you have with your health care provider. Document Released: 11/21/2000 Document Revised: 05/01/2016 Document Reviewed: 05/28/2015 Elsevier Interactive Patient Education  2018 [employer]

## 2018-07-22 NOTE — Progress Notes (Signed)
   Subjective:    Patient ID: Gary Gardner, male    DOB: 12/15/1985, 32 y.o.   MRN: [id number]  HPI Chief Complaint  Patient presents with  . multi- issues    top lip swelling,- 2 days ago has gone down, alhtete foot- about a month   He is here with complaints of a 1 month history of bilateral foot itching, and dry, peeling skin between his toes. This seemed to get worse after going to the beach last week. Has been trying a dollar store medication for symptoms. Denies fever, chills, rash to any other body part.  Reviewed allergies, medications, past medical, surgical, family, and social history.   Review of Systems Pertinent positives and negatives in the history of present illness.     Objective:   Physical Exam BP 120/80   Pulse 64   Temp 97.6 F (36.4 C) (Oral)   Wt 266 lb 3.7 oz (120.8 kg)   BMI 33.28 kg/m   Alert and oriented and in no acute distress.  Pharyngeal area is normal.  He has dryness, peeling and some mild maceration between the 4th and 5th toes on left foot. Dry skin, peeling to right foot worse between toes.  No erythema, edema, drainage. Normal sensation, pulses, ROM and strength.       Assessment & Plan:  Tinea pedis of both feet - Plan: terbinafine (LAMISIL AT) 1 % cream  He will try Lamisil for the next 1-2 weeks. Avoid spreading this to other body parts. No sign of infection. Follow up if not improving.

## 2019-03-09 ENCOUNTER — Ambulatory Visit (INDEPENDENT_AMBULATORY_CARE_PROVIDER_SITE_OTHER): Payer: BLUE CROSS/BLUE SHIELD

## 2019-03-09 ENCOUNTER — Other Ambulatory Visit: Payer: Self-pay

## 2019-03-09 ENCOUNTER — Encounter (HOSPITAL_COMMUNITY): Payer: Self-pay | Admitting: Emergency Medicine

## 2019-03-09 ENCOUNTER — Ambulatory Visit (HOSPITAL_COMMUNITY)
Admission: EM | Admit: 2019-03-09 | Discharge: 2019-03-09 | Disposition: A | Discharge status: Home / Self Care | Payer: BLUE CROSS/BLUE SHIELD | Attending: Family Medicine | Admitting: Family Medicine

## 2019-03-09 DIAGNOSIS — X500XXA Overexertion from strenuous movement or load, initial encounter: Secondary | ICD-10-CM

## 2019-03-09 DIAGNOSIS — S6991XA Unspecified injury of right wrist, hand and finger(s), initial encounter: Secondary | ICD-10-CM | POA: Diagnosis not present

## 2019-03-09 MED ORDER — DICLOFENAC SODIUM 75 MG PO TBEC: 75.0000 mg | DELAYED_RELEASE_TABLET | Freq: Two times a day (BID) | ORAL | 0 refills | Status: DC

## 2019-03-09 NOTE — ED Provider Notes (Signed)
[facility]   [id number] 03/09/19 Arrival Time: 1003  ASSESSMENT & PLAN:  1. Injury of finger of right hand, initial encounter    I have personally viewed the imaging studies ordered this visit. No fracture/dislocation appreciated.  Trial of: Meds ordered this encounter  Medications  . diclofenac (VOLTAREN) 75 MG EC tablet    Sig: Take 1 tablet (75 mg total) by mouth 2 (two) times daily.    Dispense:  14 tablet    Refill:  0   Work note given with one week lifting restrictions.  Follow-up Information    Dominica Severin, MD.   Specialty:  Orthopedic Surgery Why:  Call to schedule an appointment if you are not seeing improvement in your hand pain within the next week. Contact information: [address] [place] [address] [phone]          Reviewed expectations re: course of current medical issues. Questions answered. Outlined signs and symptoms indicating need for more acute intervention. Patient verbalized understanding. After Visit Summary given.  SUBJECTIVE: History from: patient. Gaylen Mentink is a 33 y.o. male who reports fairly persistent mild to moderate pain of his right palm/3rd finger; described as "aching or soreness" without radiation. Onset: abrupt, about 2 w ago. Injury/trama: yes, reports carrying a heavy piece of furniture; lifting "and felt my finger bend back too far, like it slipped out of place"; noticed pain shortly afterward Symptoms have progressed to a point and plateaued since beginning. Aggravating factors: movement. Alleviating factors: none identified. Associated symptoms: none reported. Extremity sensation changes or weakness: none. Self treatment: has not tried OTCs for relief of pain. History of similar: no.  Past Surgical History:  Procedure Laterality Date  . ACHILLES TENDON REPAIR      ROS: As per HPI. All other systems negative.   OBJECTIVE:  Vitals:   03/09/19 1017  BP: (!) 157/71   Pulse: 67  Resp: 20  Temp: 97.9 F (36.6 C)  TempSrc: Oral  SpO2: 97%    General appearance: alert; no distress HEENT: [place]; AT Neck: supple with FROM Extremities: . RUE: warm and well perfused; poorly localized mild tenderness over right palm just proximal to third finger; generalized 3rd finger tenderness, more proximally; without gross deformities; with mild swelling of proximal finger; with no bruising; ROM: normal with reported discomfort; able to make a fist without obvious finger deviation CV: brisk extremity capillary refill of RUE; 2+ radial pulse of RUE. Skin: warm and dry; no visible rashes Neurologic: gait normal; normal reflexes of RUE; normal sensation of RUE; normal strength of RUE Psychological: alert and cooperative; normal mood and affect  Imaging: Dg Hand Complete Right  Result Date: 03/09/2019 CLINICAL DATA:  RIGHT hand pain. Hyperextension injury to third finger. EXAM: RIGHT HAND - COMPLETE 3+ VIEW COMPARISON:  None. FINDINGS: There is no evidence of fracture or dislocation. There is no evidence of arthropathy or other focal bone abnormality. Mild soft tissue swelling. IMPRESSION: Mild soft tissue swelling.  No fracture. Electronically Signed   By: Elsie Stain M.D.   On: 03/09/2019 11:02   No Known Allergies  Past Medical History:  Diagnosis Date  . Bell's palsy    Social History   Socioeconomic History  . Marital status: Single    Spouse name: Not on file  . Number of children: Not on file  . Years of education: Not on file  . Highest education level: Not on file  Occupational History  . Not on file  Social Needs  . Financial resource strain: Not on file  . Food insecurity:    Worry: Not on file    Inability: Not on file  . Transportation needs:    Medical: Not on file    Non-medical: Not on file  Tobacco Use  . Smoking status: Never Smoker  . Smokeless tobacco: Never Used  Substance and Sexual Activity  . Alcohol use: Yes    Comment:  occasionally  . Drug use: No  . Sexual activity: Never  Lifestyle  . Physical activity:    Days per week: Not on file    Minutes per session: Not on file  . Stress: Not on file  Relationships  . Social connections:    Talks on phone: Not on file    Gets together: Not on file    Attends religious service: Not on file    Active member of club or organization: Not on file    Attends meetings of clubs or organizations: Not on file    Relationship status: Not on file  Other Topics Concern  . Not on file  Social History Narrative  . Not on file   Family History  Problem Relation Age of Onset  . Diabetes Mother   . Hypertension Mother   . Hypertension Father    Past Surgical History:  Procedure Laterality Date  . ACHILLES TENDON REPAIR        Mardella Layman, MD 03/09/19 1122

## 2019-03-09 NOTE — ED Triage Notes (Signed)
2 weeks ago was moving furniture and is concerned he hyperextended right middle finger.  Pain in finger and portion of palm at base of finger.  Difficulty making a fist

## 2019-03-09 NOTE — ED Notes (Signed)
Patient verbalizes understanding of discharge instructions. Opportunity for questioning and answers were provided. Patient discharged from [facility] by MD. 

## 2019-04-27 ENCOUNTER — Other Ambulatory Visit: Payer: Self-pay

## 2019-04-27 ENCOUNTER — Ambulatory Visit (HOSPITAL_COMMUNITY)
Admission: EM | Admit: 2019-04-27 | Discharge: 2019-04-27 | Disposition: A | Discharge status: Home / Self Care | Payer: BLUE CROSS/BLUE SHIELD | Attending: Family Medicine | Admitting: Family Medicine

## 2019-04-27 ENCOUNTER — Encounter (HOSPITAL_COMMUNITY): Payer: Self-pay

## 2019-04-27 DIAGNOSIS — M545 Low back pain, unspecified: Secondary | ICD-10-CM

## 2019-04-27 MED ORDER — DICLOFENAC SODIUM 75 MG PO TBEC: 75.0000 mg | DELAYED_RELEASE_TABLET | Freq: Two times a day (BID) | ORAL | 0 refills | Status: DC

## 2019-04-27 NOTE — ED Triage Notes (Signed)
Pt cc has lower back pain. Pt is going to need a work note. Pt states it feels like a pulled muscle type pain for 4 days .

## 2019-04-27 NOTE — Discharge Instructions (Addendum)

## 2019-04-27 NOTE — ED Provider Notes (Signed)
[facility]   [id number] 04/27/19 Arrival Time: 1004  ASSESSMENT & PLAN:  1. Acute left-sided low back pain without sciatica    Suspect muscle strain. Able to ambulate here and hemodynamically stable. No indication for imaging of back at this time given no trauma and normal neurological exam. Discussed.  Meds ordered this encounter  Medications  . diclofenac (VOLTAREN) 75 MG EC tablet    Sig: Take 1 tablet (75 mg total) by mouth 2 (two) times daily.    Dispense:  14 tablet    Refill:  0   Work note with restrictions provided.  Encourage ROM/movement as tolerated.  Follow-up Information    [facility]   Specialty:  Urgent Care Why:  As needed. Contact information: [address] [place] [phone]         Reviewed expectations re: course of current medical issues. Questions answered. Outlined signs and symptoms indicating need for more acute intervention. Patient verbalized understanding. After Visit Summary given.   SUBJECTIVE: History from: patient.  Gary Gardner is a 33 y.o. male who presents with complaint of intermittent left sided lower back discomfort. Onset gradual, over the past 4-5 days. Injury/trama: no, but questions if related to heavy lifting at work. History of back problems: rare. Discomfort described as aching without radiation. Pain worsened with certain movements, esp with bending forward at the waist, and is improved with rest. Progressive LE weakness or saddle anesthesia: none. Extremity sensation changes or weakness: none. Ambulatory without difficulty. Normal bowel/bladder habits: yes, without urinary retention. No associated abdominal pain/n/v. Self treatment: has tried nothing for pain relief.  Reports no chronic steroid use, fevers, IV drug use, or recent back surgeries or procedures.  ROS: As per HPI. All other systems negative.   OBJECTIVE:  Vitals:   04/27/19 1023 04/27/19 1027  BP:  (!) 143/71  Resp:  18  Temp:  98.3 F (36.8 C)  TempSrc:  Oral  SpO2:  100%  Weight: 117.9 kg     General appearance: alert; no distress Neck: supple with FROM; without midline tenderness CV: RRR Lungs: unlabored respirations; symmetrical air entry Abdomen: soft, non-tender; non-distended Back: mild left sided tenderness of his lower paraspinal musculature; FROM at waist; bruising: none; without midline tenderness Extremities: no edema; symmetrical with no gross deformities; normal ROM of bilateral lower extremities Skin: warm and dry Neurologic: normal gait; normal reflexes of RLE and LLE; normal sensation of RLE and LLE; normal strength of RLE and LLE Psychological: alert and cooperative; normal mood and affect  No Known Allergies  Past Medical History:  Diagnosis Date  . Bell's palsy    Social History   Socioeconomic History  . Marital status: Married    Spouse name: Not on file  . Number of children: Not on file  . Years of education: Not on file  . Highest education level: Not on file  Occupational History  . Not on file  Social Needs  . Financial resource strain: Not on file  . Food insecurity:    Worry: Not on file    Inability: Not on file  . Transportation needs:    Medical: Not on file    Non-medical: Not on file  Tobacco Use  . Smoking status: Never Smoker  . Smokeless tobacco: Never Used  Substance and Sexual Activity  . Alcohol use: Yes    Comment: occasionally  . Drug use: No  . Sexual activity: Never  Lifestyle  .  Physical activity:    Days per week: Not on file    Minutes per session: Not on file  . Stress: Not on file  Relationships  . Social connections:    Talks on phone: Not on file    Gets together: Not on file    Attends religious service: Not on file    Active member of club or organization: Not on file    Attends meetings of clubs or organizations: Not on file    Relationship status: Not on file   . Intimate partner violence:    Fear of current or ex partner: Not on file    Emotionally abused: Not on file    Physically abused: Not on file    Forced sexual activity: Not on file  Other Topics Concern  . Not on file  Social History Narrative  . Not on file   Family History  Problem Relation Age of Onset  . Diabetes Mother   . Hypertension Mother   . Hypertension Father    Past Surgical History:  Procedure Laterality Date  . ACHILLES TENDON REPAIR       Mardella Layman, MD 04/27/19 1054

## 2019-07-13 ENCOUNTER — Ambulatory Visit (INDEPENDENT_AMBULATORY_CARE_PROVIDER_SITE_OTHER): Payer: BC Managed Care – PPO

## 2019-07-13 ENCOUNTER — Other Ambulatory Visit: Payer: Self-pay

## 2019-07-13 ENCOUNTER — Ambulatory Visit (HOSPITAL_COMMUNITY)
Admission: EM | Admit: 2019-07-13 | Discharge: 2019-07-13 | Disposition: A | Discharge status: Home / Self Care | Payer: BC Managed Care – PPO | Attending: Family Medicine | Admitting: Family Medicine

## 2019-07-13 ENCOUNTER — Encounter (HOSPITAL_COMMUNITY): Payer: Self-pay

## 2019-07-13 DIAGNOSIS — S93402A Sprain of unspecified ligament of left ankle, initial encounter: Secondary | ICD-10-CM

## 2019-07-13 NOTE — ED Triage Notes (Signed)
Pt states he was playing with his little cousin and he fell on his left ankle. Pt states he has ankle pain ( left )

## 2019-07-13 NOTE — Discharge Instructions (Signed)
Your x-ray did not show any fractures. We will give you an ankle brace here in clinic. Rest, ice, elevate and you can take ibuprofen as needed for pain Follow up as needed for continued or worsening symptoms

## 2019-07-13 NOTE — ED Provider Notes (Signed)
[place]    CSN: [id number] Arrival date & time: 07/13/19  1154     History   Chief Complaint Chief Complaint  Patient presents with  . Ankle Pain    HPI Gary Gardner is a 33 y.o. male.   Patient is a 33 year old male that presents today with left ankle swelling, tenderness.  This has been present, somewhat improved over the last 2 days.  Reported started after his cousin fell on his ankle.  He has been resting it, icing and wearing an ASO.  He is able to ambulate on the ankle.  Denies any numbness, tingling.  ROS per HPI      Past Medical History:  Diagnosis Date  . Bell's palsy     Patient Active Problem List   Diagnosis Date Noted  . Sore throat 02/16/2018  . Acute pharyngitis 02/16/2018  . Bell's palsy 04/01/2017  . Family history of diabetes mellitus 04/01/2017    Past Surgical History:  Procedure Laterality Date  . ACHILLES TENDON REPAIR         Home Medications    Prior to Admission medications   Medication Sig Start Date End Date Taking? Authorizing Provider  diclofenac (VOLTAREN) 75 MG EC tablet Take 1 tablet (75 mg total) by mouth 2 (two) times daily. 04/27/19   Vanessa Kick, MD  terbinafine (LAMISIL AT) 1 % cream Apply 1 application topically 2 (two) times daily. 07/22/18   Girtha Rm, NP-C    Family History Family History  Problem Relation Age of Onset  . Diabetes Mother   . Hypertension Mother   . Hypertension Father     Social History Social History   Tobacco Use  . Smoking status: Never Smoker  . Smokeless tobacco: Never Used  Substance Use Topics  . Alcohol use: Yes    Comment: occasionally  . Drug use: No     Allergies   Patient has no known allergies.   Review of Systems Review of Systems   Physical Exam Triage Vital Signs ED Triage Vitals  Enc Vitals Group     BP 07/13/19 1222 140/88     Pulse Rate 07/13/19 1222 88     Resp 07/13/19 1220 18     Temp 07/13/19 1220 98.5 F (36.9 C)   Temp Source 07/13/19 1220 Oral     SpO2 07/13/19 1220 100 %     Weight 07/13/19 1220 260 lb (117.9 kg)     Height --      Head Circumference --      Peak Flow --      Pain Score 07/13/19 1220 8     Pain Loc --      Pain Edu? --      Excl. in [place]? --    No data found.  Updated Vital Signs BP 140/88 (BP Location: Right Arm)   Pulse 88   Temp 98.5 F (36.9 C) (Oral)   Resp 18   Wt 260 lb (117.9 kg)   SpO2 100%   BMI 32.50 kg/m   Visual Acuity Right Eye Distance:   Left Eye Distance:   Bilateral Distance:    Right Eye Near:   Left Eye Near:    Bilateral Near:     Physical Exam Vitals signs and nursing note reviewed.  Constitutional:      Appearance: Normal appearance.  HENT:     Head: Normocephalic and atraumatic.     Nose: Nose normal.  Eyes:  Conjunctiva/sclera: Conjunctivae normal.  Neck:     Musculoskeletal: Normal range of motion.  Pulmonary:     Effort: Pulmonary effort is normal.  Musculoskeletal: Normal range of motion.        General: Swelling and tenderness present.     Comments: Swelling and tenderness to lateral and medial malleolus.  Good flexion, extension, rotation.  Pedal pulse strong Able to wiggle toes.  Normal temperature and color.  Skin:    General: Skin is warm and dry.     Capillary Refill: Capillary refill takes less than 2 seconds.  Neurological:     Mental Status: He is alert.  Psychiatric:        Mood and Affect: Mood normal.      UC Treatments / Results  Labs (all labs ordered are listed, but only abnormal results are displayed) Labs Reviewed - No data to display  EKG   Radiology Dg Ankle Complete Left  Result Date: 07/13/2019 CLINICAL DATA:  Left ankle pain since 07/11/2019 when another person fell on the patient's ankle. Initial encounter. EXAM: LEFT ANKLE COMPLETE - 3+ VIEW COMPARISON:  None. FINDINGS: There is no evidence of fracture, dislocation, or joint effusion. There is no evidence of arthropathy or other focal  bone abnormality. Soft tissues are unremarkable. IMPRESSION: Negative exam. Electronically Signed   By: Drusilla Kannerhomas  Dalessio M.D.   On: 07/13/2019 12:56    Procedures Procedures (including critical care time)  Medications Ordered in UC Medications - No data to display  Initial Impression / Assessment and Plan / UC Course  I have reviewed the triage vital signs and the nursing notes.  Pertinent labs & imaging results that were available during my care of the patient were reviewed by me and considered in my medical decision making (see chart for details).     No acute abnormalities on x-ray.  Most likely bad sprain of the left ankle. Rest, ice, elevate and take ibuprofen as needed ASO provided in clinic Final Clinical Impressions(s) / UC Diagnoses   Final diagnoses:  Sprain of left ankle, unspecified ligament, initial encounter     Discharge Instructions     Your x-ray did not show any fractures. We will give you an ankle brace here in clinic. Rest, ice, elevate and you can take ibuprofen as needed for pain Follow up as needed for continued or worsening symptoms     ED Prescriptions    None     Controlled Substance Prescriptions [place] Controlled Substance Registry consulted? Not Applicable   Janace ArisBast, Dillin Lofgren A, NP 07/13/19 1354

## 2019-11-21 ENCOUNTER — Ambulatory Visit (HOSPITAL_COMMUNITY)
Admission: EM | Admit: 2019-11-21 | Discharge: 2019-11-21 | Disposition: A | Discharge status: Home / Self Care | Payer: BC Managed Care – PPO | Attending: Physician Assistant | Admitting: Physician Assistant

## 2019-11-21 ENCOUNTER — Other Ambulatory Visit: Payer: Self-pay

## 2019-11-21 ENCOUNTER — Encounter (HOSPITAL_COMMUNITY): Payer: Self-pay | Admitting: Emergency Medicine

## 2019-11-21 DIAGNOSIS — Z833 Family history of diabetes mellitus: Secondary | ICD-10-CM | POA: Insufficient documentation

## 2019-11-21 DIAGNOSIS — Z20828 Contact with and (suspected) exposure to other viral communicable diseases: Secondary | ICD-10-CM | POA: Insufficient documentation

## 2019-11-21 DIAGNOSIS — Z8249 Family history of ischemic heart disease and other diseases of the circulatory system: Secondary | ICD-10-CM | POA: Insufficient documentation

## 2019-11-21 DIAGNOSIS — R05 Cough: Secondary | ICD-10-CM | POA: Diagnosis not present

## 2019-11-21 DIAGNOSIS — J069 Acute upper respiratory infection, unspecified: Secondary | ICD-10-CM | POA: Diagnosis not present

## 2019-11-21 DIAGNOSIS — R059 Cough, unspecified: Secondary | ICD-10-CM

## 2019-11-21 NOTE — ED Triage Notes (Signed)
Coughing started yesterday.  Today patient complains of head congestion.  Patient feels drainage in throat.  Denies runny nose

## 2019-11-21 NOTE — Discharge Instructions (Addendum)
Viral infection. Covid test is pending. Out of work until results are in, will call or check mychart. Treat congestion with Sudafed (behind the counter) and Deslysm OTC for cough. Ibuprofen 600-800mg  for sinus congestion and achiness will also help. Feel better.

## 2019-11-21 NOTE — ED Notes (Signed)
nasal swab placed in lab, label verified

## 2019-11-21 NOTE — ED Provider Notes (Signed)
[place]    CSN: [id number] Arrival date & time: 11/21/19  0848      History   Chief Complaint Chief Complaint  Patient presents with  . URI    HPI Gary Gardner is a 33 y.o. male.   Presents with a less than 24 history of nasal congestion, dry cough and post nasal drip. No fevers are noted. Mild body aches. No known exposures.      Past Medical History:  Diagnosis Date  . Bell's palsy     Patient Active Problem List   Diagnosis Date Noted  . Sore throat 02/16/2018  . Acute pharyngitis 02/16/2018  . Bell's palsy 04/01/2017  . Family history of diabetes mellitus 04/01/2017    Past Surgical History:  Procedure Laterality Date  . ACHILLES TENDON REPAIR         Home Medications    Prior to Admission medications   Medication Sig Start Date End Date Taking? Authorizing Provider  Pseudoeph-Doxylamine-DM-APAP (NYQUIL PO) Take by mouth.   Yes [provider]  diclofenac (VOLTAREN) 75 MG EC tablet Take 1 tablet (75 mg total) by mouth 2 (two) times daily. 04/27/19   Vanessa Kick, MD  terbinafine (LAMISIL AT) 1 % cream Apply 1 application topically 2 (two) times daily. 07/22/18   Girtha Rm, NP-C    Family History Family History  Problem Relation Age of Onset  . Diabetes Mother   . Hypertension Mother   . Hypertension Father     Social History Social History   Tobacco Use  . Smoking status: Never Smoker  . Smokeless tobacco: Never Used  Substance Use Topics  . Alcohol use: Yes    Comment: occasionally  . Drug use: No     Allergies   Patient has no known allergies.   Review of Systems Review of Systems  Constitutional: Negative for chills, fatigue and fever.  HENT: Positive for congestion and postnasal drip. Negative for rhinorrhea, sinus pressure and sinus pain.   Eyes: Negative.   Respiratory: Positive for cough. Negative for shortness of breath.   Cardiovascular: Negative.   Musculoskeletal: Negative for  arthralgias.  Allergic/Immunologic: Negative.   Neurological: Negative for headaches.  Psychiatric/Behavioral: Negative.      Physical Exam Triage Vital Signs ED Triage Vitals [11/21/19 0936]  Enc Vitals Group     BP      Pulse      Resp      Temp      Temp src      SpO2      Weight      Height      Head Circumference      Peak Flow      Pain Score 0     Pain Loc      Pain Edu?      Excl. in [place]?    No data found.  Updated Vital Signs There were no vitals taken for this visit.  Visual Acuity Right Eye Distance:   Left Eye Distance:   Bilateral Distance:    Right Eye Near:   Left Eye Near:    Bilateral Near:     Physical Exam Nursing note reviewed.  Constitutional:      Appearance: Normal appearance.  HENT:     Nose: Nose normal.     Comments: Mild erythematous nasal turbinates, clear drainage    Mouth/Throat:     Pharynx: Oropharynx is clear.  Eyes:     General: No scleral icterus. Cardiovascular:  Rate and Rhythm: Normal rate.  Pulmonary:     Effort: Pulmonary effort is normal.     Breath sounds: Normal breath sounds. No wheezing or rhonchi.  Musculoskeletal:     Cervical back: Normal range of motion.  Skin:    General: Skin is warm and dry.  Neurological:     General: No focal deficit present.     Mental Status: He is alert.  Psychiatric:        Mood and Affect: Mood normal.        Behavior: Behavior normal.      UC Treatments / Results  Labs (all labs ordered are listed, but only abnormal results are displayed) Labs Reviewed - No data to display  EKG   Radiology No results found.  Procedures Procedures (including critical care time)  Medications Ordered in UC Medications - No data to display  Initial Impression / Assessment and Plan / UC Course  I have reviewed the triage vital signs and the nursing notes.  Pertinent labs & imaging results that were available during my care of the patient were reviewed by me and considered  in my medical decision making (see chart for details).     Viral URI-Covid-19  pending. Treat symptomatically with OTC regimens, rest and work note.  Final Clinical Impressions(s) / UC Diagnoses   Final diagnoses:  None   Discharge Instructions   None    ED Prescriptions    None     PDMP not reviewed this encounter.   Riki Sheer, [place] 11/21/19 [id number]

## 2019-11-23 LAB — NOVEL CORONAVIRUS, NAA (HOSP ORDER, SEND-OUT TO REF LAB; TAT 18-24 HRS): SARS-CoV-2, NAA: NOT DETECTED

## 2020-11-28 ENCOUNTER — Ambulatory Visit
Admission: EM | Admit: 2020-11-28 | Discharge: 2020-11-28 | Disposition: A | Discharge status: Home / Self Care | Payer: BC Managed Care – PPO | Attending: Sports Medicine | Admitting: Sports Medicine

## 2020-11-28 ENCOUNTER — Encounter: Payer: Self-pay | Admitting: Emergency Medicine

## 2020-11-28 ENCOUNTER — Other Ambulatory Visit: Payer: Self-pay

## 2020-11-28 DIAGNOSIS — R059 Cough, unspecified: Secondary | ICD-10-CM | POA: Diagnosis present

## 2020-11-28 DIAGNOSIS — Z20822 Contact with and (suspected) exposure to covid-19: Secondary | ICD-10-CM | POA: Insufficient documentation

## 2020-11-28 DIAGNOSIS — J069 Acute upper respiratory infection, unspecified: Secondary | ICD-10-CM | POA: Diagnosis not present

## 2020-11-28 DIAGNOSIS — J029 Acute pharyngitis, unspecified: Secondary | ICD-10-CM | POA: Diagnosis not present

## 2020-11-28 LAB — RESP PANEL BY RT-PCR (FLU A&B, COVID) ARPGX2
Influenza A by PCR: NEGATIVE
Influenza B by PCR: NEGATIVE
SARS Coronavirus 2 by RT PCR: NEGATIVE

## 2020-11-28 MED ORDER — PROMETHAZINE-DM 6.25-15 MG/5ML PO SYRP: 5.0000 mL | ORAL_SOLUTION | Freq: Four times a day (QID) | ORAL | 0 refills | Status: DC | PRN

## 2020-11-28 MED ORDER — BENZONATATE 100 MG PO CAPS: 200.0000 mg | ORAL_CAPSULE | Freq: Three times a day (TID) | ORAL | 0 refills | Status: DC

## 2020-11-28 NOTE — Discharge Instructions (Addendum)
Your testing today was negative for Covid and flu.  Your symptoms are most consistent with a viral upper respiratory infection.  Use Tylenol and ibuprofen as needed for any fever or body aches.  Use the Tessalon Perles during the day as needed for cough.  Take them every 8 hours as needed with a small sip of water.  They may give you some numbness to the base of your tongue or metallic taste in her mouth, this is normal.  Use the cough syrup as needed for cough, congestion, and sleep.  Reserve this for bedtime as it will make you drowsy.  If you develop any new or worsening symptoms follow-up with your regular provider.

## 2020-11-28 NOTE — ED Provider Notes (Signed)
[facility]    CSN: [id number] Arrival date & time: 11/28/20  1007      History   Chief Complaint Chief Complaint  Patient presents with  . Cough    HPI Gary Gardner is a 34 y.o. male.   HPI   34 year old male here for evaluation of cough nasal congestion and chest congestion.  Patient reports that he has had symptoms for the past 3 days. Patient states that his cough and his nasal discharge are worse in the morning and the are usually a darker yellow in color. As a day goes on his symptoms become less pronounced and the mucus becomes more clear. Patient denies fever, sore throat, ear pain or pressure, shortness of breath or wheezing, GI complaints, body aches, change in sense of taste or smell, or sick contacts with similar symptoms. Patient does report that there was a GI bug running through the house with his daughter. Patient has not had his Covid vaccine or his flu shot.  Past Medical History:  Diagnosis Date  . Bell's palsy     Patient Active Problem List   Diagnosis Date Noted  . Sore throat 02/16/2018  . Acute pharyngitis 02/16/2018  . Bell's palsy 04/01/2017  . Family history of diabetes mellitus 04/01/2017    Past Surgical History:  Procedure Laterality Date  . ACHILLES TENDON REPAIR         Home Medications    Prior to Admission medications   Medication Sig Start Date End Date Taking? Authorizing Provider  benzonatate (TESSALON) 100 MG capsule Take 2 capsules (200 mg total) by mouth every 8 (eight) hours. 11/28/20   Becky Augusta, NP  promethazine-dextromethorphan (PROMETHAZINE-DM) 6.25-15 MG/5ML syrup Take 5 mLs by mouth 4 (four) times daily as needed. 11/28/20   Becky Augusta, NP    Family History Family History  Problem Relation Age of Onset  . Diabetes Mother   . Hypertension Mother   . Hypertension Father     Social History Social History   Tobacco Use  . Smoking status: Never Smoker  . Smokeless tobacco: Never Used   Vaping Use  . Vaping Use: Never used  Substance Use Topics  . Alcohol use: Yes    Comment: occasionally  . Drug use: No     Allergies   Patient has no known allergies.   Review of Systems Review of Systems  Constitutional: Negative for activity change, appetite change and fever.  HENT: Positive for congestion and rhinorrhea. Negative for ear pain, sinus pressure, sinus pain and sore throat.   Respiratory: Positive for cough. Negative for shortness of breath and wheezing.   Cardiovascular: Negative for chest pain.  Gastrointestinal: Negative for abdominal pain, diarrhea, nausea and vomiting.  Musculoskeletal: Negative for arthralgias and myalgias.  Skin: Negative for rash.  Neurological: Negative for headaches.  Hematological: Negative.   Psychiatric/Behavioral: Negative.      Physical Exam Triage Vital Signs ED Triage Vitals  Enc Vitals Group     BP 11/28/20 1104 129/78     Pulse Rate 11/28/20 1104 70     Resp 11/28/20 1104 18     Temp 11/28/20 1104 98 F (36.7 C)     Temp Source 11/28/20 1104 Oral     SpO2 11/28/20 1104 99 %     Weight 11/28/20 1102 259 lb 14.8 oz (117.9 kg)     Height 11/28/20 1102 6\' 2"  (1.88 m)     Head Circumference --      Peak Flow --  Pain Score 11/28/20 1101 0     Pain Loc --      Pain Edu? --      Excl. in [place]? --    No data found.  Updated Vital Signs BP 129/78 (BP Location: Left Arm)   Pulse 70   Temp 98 F (36.7 C) (Oral)   Resp 18   Ht 6\' 2"  (1.88 m)   Wt 259 lb 14.8 oz (117.9 kg)   SpO2 99%   BMI 33.37 kg/m   Visual Acuity Right Eye Distance:   Left Eye Distance:   Bilateral Distance:    Right Eye Near:   Left Eye Near:    Bilateral Near:     Physical Exam Vitals and nursing note reviewed.  Constitutional:      General: He is not in acute distress.    Appearance: Normal appearance. He is not toxic-appearing.  HENT:     Head: Normocephalic and atraumatic.     Right Ear: Tympanic membrane, ear canal and  external ear normal.     Left Ear: Tympanic membrane, ear canal and external ear normal.     Nose: Congestion and rhinorrhea present.     Comments: Nasal mucosa is erythematous and edematous with scant clear nasal discharge.    Mouth/Throat:     Pharynx: Oropharynx is clear. No oropharyngeal exudate or posterior oropharyngeal erythema.  Cardiovascular:     Rate and Rhythm: Normal rate and regular rhythm.     Pulses: Normal pulses.     Heart sounds: Normal heart sounds. No murmur heard. No gallop.   Pulmonary:     Effort: Pulmonary effort is normal.     Breath sounds: Normal breath sounds. No wheezing, rhonchi or rales.  Musculoskeletal:        General: No swelling or tenderness. Normal range of motion.     Cervical back: Normal range of motion and neck supple.  Lymphadenopathy:     Cervical: No cervical adenopathy.  Skin:    General: Skin is warm and dry.     Capillary Refill: Capillary refill takes less than 2 seconds.     Findings: No erythema or rash.  Neurological:     General: No focal deficit present.     Mental Status: He is alert and oriented to person, place, and time.  Psychiatric:        Mood and Affect: Mood normal.        Behavior: Behavior normal.        Thought Content: Thought content normal.        Judgment: Judgment normal.      UC Treatments / Results  Labs (all labs ordered are listed, but only abnormal results are displayed) Labs Reviewed  RESP PANEL BY RT-PCR (FLU A&B, COVID) [id number]    EKG   Radiology No results found.  Procedures Procedures (including critical care time)  Medications Ordered in UC Medications - No data to display  Initial Impression / Assessment and Plan / UC Course  I have reviewed the triage vital signs and the nursing notes.  Pertinent labs & imaging results that were available during my care of the patient were reviewed by me and considered in my medical decision making (see chart for details).   Patient is here  for evaluation of cold symptoms that has had for the past 3 days. Patient is nontoxic in appearance and is in no acute distress. Patient does have some erythematous and edematous nasal mucosa but his discharge is  clear. Patient does have clear postnasal drip without posterior oropharyngeal erythema. Patient states that he has missed 3 days of work already due to his symptoms. Respiratory triplex panel was collected at triage.  Respiratory panel is negative for Covid or flu.  We will discharge patient home with diagnosis of viral URI with cough.  Will give patient Tessalon Perles and Promethazine DM for cough symptoms.  If his symptoms continue he can follow-up with his primary care provider.  Final Clinical Impressions(s) / UC Diagnoses   Final diagnoses:  Viral URI with cough     Discharge Instructions     Your testing today was negative for Covid and flu.  Your symptoms are most consistent with a viral upper respiratory infection.  Use Tylenol and ibuprofen as needed for any fever or body aches.  Use the Tessalon Perles during the day as needed for cough.  Take them every 8 hours as needed with a small sip of water.  They may give you some numbness to the base of your tongue or metallic taste in her mouth, this is normal.  Use the cough syrup as needed for cough, congestion, and sleep.  Reserve this for bedtime as it will make you drowsy.  If you develop any new or worsening symptoms follow-up with your regular provider.    ED Prescriptions    Medication Sig Dispense Auth. Provider   benzonatate (TESSALON) 100 MG capsule Take 2 capsules (200 mg total) by mouth every 8 (eight) hours. 21 capsule Becky Augusta, NP   promethazine-dextromethorphan (PROMETHAZINE-DM) 6.25-15 MG/5ML syrup Take 5 mLs by mouth 4 (four) times daily as needed. 118 mL Becky Augusta, NP     PDMP not reviewed this encounter.   Becky Augusta, NP 11/28/20 1223

## 2020-11-28 NOTE — ED Triage Notes (Signed)
Pt c/o cough, nasal congestion, chest congestion. Started about 3 days ago. Denies fever.
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
## 2023-04-13 ENCOUNTER — Ambulatory Visit
Admission: EM | Admit: 2023-04-13 | Discharge: 2023-04-13 | Disposition: A | Discharge status: Home / Self Care | Payer: BC Managed Care – PPO | Attending: Urgent Care | Admitting: Urgent Care

## 2023-04-13 DIAGNOSIS — R197 Diarrhea, unspecified: Secondary | ICD-10-CM

## 2023-04-13 MED ORDER — CIPROFLOXACIN HCL 500 MG PO TABS
500.0000 mg | ORAL_TABLET | Freq: Two times a day (BID) | ORAL | 0 refills | Status: AC
Start: 2023-04-13 — End: 2023-04-18

## 2023-04-13 NOTE — ED Triage Notes (Signed)
Patient presents to UC for HA and diarrhea since Thursday. States has been drinking lots of fluids. Not taking any OTC meds.

## 2023-04-13 NOTE — ED Provider Notes (Signed)
Gary Gardner    CSN: [id number] Arrival date & time: 04/13/23  1102      History   Chief Complaint Chief Complaint  Patient presents with   Headache   Diarrhea    HPI Gary Gardner is a 37 y.o. male.    Headache Associated symptoms: diarrhea   Diarrhea Associated symptoms: headaches     Presents to urgent care with complaint of headache and diarrhea since Thursday.  Endorses pushing hydration.  Not using any OTC medication.  Denies fever. Denies dizziness.  Past Medical History:  Diagnosis Date   Bell's palsy     Patient Active Problem List   Diagnosis Date Noted   Sore throat 02/16/2018   Acute pharyngitis 02/16/2018   Bell's palsy 04/01/2017   Family history of diabetes mellitus 04/01/2017    Past Surgical History:  Procedure Laterality Date   ACHILLES TENDON REPAIR         Home Medications    Prior to Admission medications   Medication Sig Start Date End Date Taking? Authorizing Provider  benzonatate (TESSALON) 100 MG capsule Take 2 capsules (200 mg total) by mouth every 8 (eight) hours. 11/28/20   Becky Augusta, NP  promethazine-dextromethorphan (PROMETHAZINE-DM) 6.25-15 MG/5ML syrup Take 5 mLs by mouth 4 (four) times daily as needed. 11/28/20   Becky Augusta, NP    Family History Family History  Problem Relation Age of Onset   Diabetes Mother    Hypertension Mother    Hypertension Father     Social History Social History   Tobacco Use   Smoking status: Never   Smokeless tobacco: Never  Vaping Use   Vaping Use: Never used  Substance Use Topics   Alcohol use: Yes    Comment: occasionally   Drug use: No     Allergies   Patient has no known allergies.   Review of Systems Review of Systems  Gastrointestinal:  Positive for diarrhea.  Neurological:  Positive for headaches.     Physical Exam Triage Vital Signs ED Triage Vitals  Enc Vitals Group     BP      Pulse      Resp      Temp      Temp src      SpO2       Weight      Height      Head Circumference      Peak Flow      Pain Score      Pain Loc      Pain Edu?      Excl. in [place]?    No data found.  Updated Vital Signs There were no vitals taken for this visit.  Visual Acuity Right Eye Distance:   Left Eye Distance:   Bilateral Distance:    Right Eye Near:   Left Eye Near:    Bilateral Near:     Physical Exam Vitals reviewed.  Constitutional:      Appearance: He is well-developed.  Abdominal:     Palpations: Abdomen is soft.     Tenderness: There is no abdominal tenderness.  Skin:    General: Skin is warm and dry.  Neurological:     Mental Status: He is alert and oriented to person, place, and time.  Psychiatric:        Mood and Affect: Mood normal.        Behavior: Behavior normal.      UC Treatments / Results  Labs (all labs  ordered are listed, but only abnormal results are displayed) Labs Reviewed - No data to display  EKG   Radiology No results found.  Procedures Procedures (including critical care time)  Medications Ordered in UC Medications - No data to display  Initial Impression / Assessment and Plan / UC Course  I have reviewed the triage vital signs and the nursing notes.  Pertinent labs & imaging results that were available during my care of the patient were reviewed by me and considered in my medical decision making (see chart for details).   Gary Gardner is a 37 y.o. male presenting with diarrhea. Patient is afebrile without recent antipyretics, satting well on room air. Overall is well appearing and non-toxic, well hydrated, without respiratory distress.   Will treat for acute diarrhea with ciprofloxacin x 5 days.  Recommending use of Imodium as directed.  Counseled patient on potential for adverse effects with medications prescribed/recommended today, ER and return-to-clinic precautions discussed, patient verbalized understanding and agreement with care plan.    Final Clinical  Impressions(s) / UC Diagnoses   Final diagnoses:  None   Discharge Instructions   None    ED Prescriptions   None    PDMP not reviewed this encounter.   Charma Igo, [place] 04/13/23 1205

## 2023-04-13 NOTE — Discharge Instructions (Addendum)
Start using OTC Imodium using packaging directions.  Take prescribed antibiotic.  Continue to push fluids, especially with electrolytes.

## 2024-04-06 ENCOUNTER — Ambulatory Visit
Admission: EM | Admit: 2024-04-06 | Discharge: 2024-04-06 | Disposition: A | Discharge status: Home / Self Care | Attending: Physician Assistant | Admitting: Physician Assistant

## 2024-04-06 VITALS — BP 110/74 | HR 70 | Temp 98.5°F | Resp 16 | Ht 74.0 in | Wt 300.0 lb

## 2024-04-06 DIAGNOSIS — B370 Candidal stomatitis: Secondary | ICD-10-CM

## 2024-04-06 DIAGNOSIS — K121 Other forms of stomatitis: Secondary | ICD-10-CM | POA: Diagnosis not present

## 2024-04-06 MED ORDER — NYSTATIN 100000 UNIT/ML MT SUSP: OROMUCOSAL | 0 refills | Status: AC

## 2024-04-06 NOTE — Discharge Instructions (Addendum)
-  Oral thrush (fungal infection) vs viral stomatitis -Use mouthwash as directed.  -Consider chloraseptic spray, ibuprofen, tylenol  -Follow up with PCP if recurrent or worsening symptoms  [facility], [address], [address]. Phone number: [phone] Please call this number to establish with primary care

## 2024-04-06 NOTE — ED Triage Notes (Signed)
 Pt c/o tongue swelling & white patches on side of tongue x2 days.

## 2024-04-06 NOTE — ED Provider Notes (Signed)
 [facility]    CSN: [id number] Arrival date & time: 04/06/24  1656      History   Chief Complaint Chief Complaint  Patient presents with   Oral Swelling    HPI Dakwan Redondo is a 38 y.o. male presenting for 2 day history of tongue pain with whitish exudates in mouth. Denies sore throat. Has had a mild cough.  No associated fever, difficulty swallowing, lip lesion.  Reports having canker sores in the past.  Unsure if he has had cold sores before.  Patient occasionally smokes Black and mild but does not regularly smoke tobacco.  He denies history of diabetes and is not immunocompromised.  Does not use any inhalers.  Not treating condition yet in any way.  HPI  Past Medical History:  Diagnosis Date   Bell's palsy     Patient Active Problem List   Diagnosis Date Noted   Sore throat 02/16/2018   Acute pharyngitis 02/16/2018   Bell's palsy 04/01/2017   Family history of diabetes mellitus 04/01/2017    Past Surgical History:  Procedure Laterality Date   ACHILLES TENDON REPAIR         Home Medications    Prior to Admission medications   Medication Sig Start Date End Date Taking? Authorizing Provider  nystatin (MYCOSTATIN) 100000 UNIT/ML suspension Swish, retain in mouth for several seconds, spit q6h until 48 hours after symptoms resolve 04/06/24  Yes Floydene Hy, PA-C    Family History Family History  Problem Relation Age of Onset   Diabetes Mother    Hypertension Mother    Hypertension Father     Social History Social History   Tobacco Use   Smoking status: Never   Smokeless tobacco: Never  Vaping Use   Vaping status: Never Used  Substance Use Topics   Alcohol use: Yes    Comment: occasionally   Drug use: No     Allergies   Patient has no known allergies.   Review of Systems Review of Systems  Constitutional:  Negative for fatigue and fever.  HENT:  Positive for mouth sores. Negative for congestion, rhinorrhea, sore throat and  trouble swallowing.   Respiratory:  Positive for cough.   Neurological:  Negative for weakness.  Hematological:  Negative for adenopathy.     Physical Exam Triage Vital Signs ED Triage Vitals [04/06/24 1658]  Encounter Vitals Group     BP      Systolic BP Percentile      Diastolic BP Percentile      Pulse      Resp 16     Temp      Temp Source Oral     SpO2      Weight      Height      Head Circumference      Peak Flow      Pain Score      Pain Loc      Pain Education      Exclude from Growth Chart    No data found.  Updated Vital Signs BP 110/74 (BP Location: Right Arm)   Pulse 70   Temp 98.5 F (36.9 C) (Oral)   Resp 16   Ht 6\' 2"  (1.88 m)   Wt 300 lb (136.1 kg)   SpO2 95%   BMI 38.52 kg/m     Physical Exam Vitals and nursing note reviewed.  Constitutional:      General: He is not in acute distress.  Appearance: Normal appearance. He is well-developed. He is not ill-appearing.  HENT:     Head: Normocephalic and atraumatic.     Comments: Erythematous macules of posterior pharynx. Scant whitish exudates of buccal mucosa    Nose: Nose normal.     Mouth/Throat:     Mouth: Mucous membranes are moist. Oral lesions present.     Tongue: Lesions (whitish exudates) present.     Pharynx: Oropharynx is clear. Posterior oropharyngeal erythema present.     Tonsils: No tonsillar exudate.  Eyes:     General: No scleral icterus.    Conjunctiva/sclera: Conjunctivae normal.  Cardiovascular:     Rate and Rhythm: Normal rate and regular rhythm.  Pulmonary:     Effort: Pulmonary effort is normal. No respiratory distress.     Breath sounds: Normal breath sounds.  Musculoskeletal:     Cervical back: Neck supple.  Skin:    General: Skin is warm and dry.     Capillary Refill: Capillary refill takes less than 2 seconds.  Neurological:     General: No focal deficit present.     Mental Status: He is alert. Mental status is at baseline.     Motor: No weakness.      Gait: Gait normal.  Psychiatric:        Mood and Affect: Mood normal.        Behavior: Behavior normal.      UC Treatments / Results  Labs (all labs ordered are listed, but only abnormal results are displayed) Labs Reviewed - No data to display  EKG   Radiology No results found.  Procedures Procedures (including critical care time)  Medications Ordered in UC Medications - No data to display  Initial Impression / Assessment and Plan / UC Course  I have reviewed the triage vital signs and the nursing notes.  Pertinent labs & imaging results that were available during my care of the patient were reviewed by me and considered in my medical decision making (see chart for details).   38 year old male presents for tongue pain and whitish exudate/mouth lesions for the past couple days.  He has no history of HIV and is not immunocompromised.  Is not currently on any corticosteroids and no history of diabetes or use of corticosteroid inhalers.  No history of similar problems.  Presentation consistent with oral thrush versus viral stomatitis.  More likely thrush.  Sent nystatin mouthwash discussed Chloraseptic spray, ibuprofen, Tylenol .  Information given to him to follow-up with further medical as he does not have a primary care provider.  Reviewed return precautions.   Final Clinical Impressions(s) / UC Diagnoses   Final diagnoses:  Oral thrush  Stomatitis     Discharge Instructions      -Oral thrush (fungal infection) vs viral stomatitis -Use mouthwash as directed.  -Consider chloraseptic spray, ibuprofen, tylenol  -Follow up with PCP if recurrent or worsening symptoms  [facility] at [facility], [address], [address]. Phone number: [phone] Please call this number to establish with primary care      ED Prescriptions     Medication Sig Dispense Auth. Provider   nystatin (MYCOSTATIN) 100000 UNIT/ML suspension Swish, retain in mouth for  several seconds, spit q6h until 48 hours after symptoms resolve 473 mL Floydene Hy, PA-C      PDMP not reviewed this encounter.   Floydene Hy, PA-C 04/06/24 1728
